# Patient Record
Sex: Male | Born: 1975 | Race: White | Hispanic: No | Marital: Single | Smoking: Never smoker
Health system: Southern US, Community
[De-identification: ages and names within clinical notes are randomized; demographics above are authoritative.]

## PROBLEM LIST (undated history)

## (undated) DIAGNOSIS — E785 Hyperlipidemia, unspecified: Secondary | ICD-10-CM

## (undated) DIAGNOSIS — G459 Transient cerebral ischemic attack, unspecified: Secondary | ICD-10-CM

## (undated) DIAGNOSIS — F1010 Alcohol abuse, uncomplicated: Secondary | ICD-10-CM

---

## 1997-12-10 ENCOUNTER — Encounter: Admission: RE | Admit: 1997-12-10 | Discharge: 1997-12-10 | Payer: Self-pay | Admitting: Family Medicine

## 1998-01-21 ENCOUNTER — Encounter: Admission: RE | Admit: 1998-01-21 | Discharge: 1998-01-21 | Payer: Self-pay | Admitting: Family Medicine

## 2001-02-25 ENCOUNTER — Encounter: Payer: Self-pay | Admitting: Emergency Medicine

## 2001-02-25 ENCOUNTER — Emergency Department (HOSPITAL_COMMUNITY): Admission: EM | Admit: 2001-02-25 | Discharge: 2001-02-25 | Payer: Self-pay | Admitting: Emergency Medicine

## 2004-04-15 ENCOUNTER — Emergency Department (HOSPITAL_COMMUNITY): Admission: EM | Admit: 2004-04-15 | Discharge: 2004-04-15 | Payer: Self-pay | Admitting: Family Medicine

## 2004-04-19 ENCOUNTER — Ambulatory Visit (HOSPITAL_COMMUNITY): Admission: RE | Admit: 2004-04-19 | Discharge: 2004-04-19 | Payer: Self-pay | Admitting: Orthopaedic Surgery

## 2004-08-31 ENCOUNTER — Emergency Department (HOSPITAL_COMMUNITY): Admission: EM | Admit: 2004-08-31 | Discharge: 2004-08-31 | Payer: Self-pay | Admitting: Family Medicine

## 2006-02-01 ENCOUNTER — Emergency Department (HOSPITAL_COMMUNITY): Admission: EM | Admit: 2006-02-01 | Discharge: 2006-02-01 | Payer: Self-pay | Admitting: Emergency Medicine

## 2006-12-14 ENCOUNTER — Emergency Department (HOSPITAL_COMMUNITY): Admission: EM | Admit: 2006-12-14 | Discharge: 2006-12-14 | Payer: Self-pay | Admitting: Family Medicine

## 2008-02-18 ENCOUNTER — Emergency Department (HOSPITAL_COMMUNITY): Admission: EM | Admit: 2008-02-18 | Discharge: 2008-02-18 | Payer: Self-pay | Admitting: Emergency Medicine

## 2008-02-20 ENCOUNTER — Emergency Department (HOSPITAL_COMMUNITY): Admission: EM | Admit: 2008-02-20 | Discharge: 2008-02-20 | Payer: Self-pay | Admitting: Family Medicine

## 2008-02-26 ENCOUNTER — Emergency Department (HOSPITAL_COMMUNITY): Admission: EM | Admit: 2008-02-26 | Discharge: 2008-02-26 | Payer: Self-pay | Admitting: Emergency Medicine

## 2011-01-20 NOTE — Op Note (Signed)
NAME:  NAGEE, GOATES                   ACCOUNT NO.:  [id number]   MEDICAL RECORD NO.:  [id number]                   PATIENT TYPE:  OIB   LOCATION:  2867                                 FACILITY:  [facility]   PHYSICIAN:  Justan Gaede. Magnus Ivan, M.D.       DATE OF BIRTH:  1976-08-02   DATE OF PROCEDURE:  04/19/2004  DATE OF DISCHARGE:  04/19/2004                                 OPERATIVE REPORT   PREOPERATIVE DIAGNOSIS:  Left index finger proximal phalanx fracture.   PROCEDURE:  Open reduction internal fixation of left index finger proximal  phalanx fracture.   SURGEON:  Crecencio Kwiatek. Magnus Ivan, M.D.   ANESTHESIA:  General.   ESTIMATED BLOOD LOSS:  Minimal.   TOURNIQUET TIME:  131 min.   ANTIBIOTICS:  Ancef 1 g.   COMPLICATIONS:  None.   INDICATIONS:  Mr. Kaneshiro is a gentleman who closed his left nondominant  hand in a car door.  When the car door slammed against his hand it caught  his left index finger, and he sustained an oblique fracture of the proximal  phalanx of his index finger.  Due to the instability of this fracture, it is  recommended that he undergo open reduction internal fixation.  He agreed  with this, given the risks and benefits that were explained to him.   DESCRIPTION OF PROCEDURE:  After being brought up to the operating room,  general anesthesia was obtained and he was placed supine on the operating  room table with his left arm on the radiolucent arm table.  Tourniquet was  placed around his upper arm, and then his arm and hand were prepped and  draped with Duraprep and sterile drapes.  An Esmarch was used to wrap around  his hand, and tourniquet was set to  250 mmHg pressure.  The fracture was assessed under fluoroscopy, and then a  dorsal approach to the index finger was made.  The incision was carried  through the subcutaneous tissue down to the level of the proximal phalanx.  The extensor tendon was identified; however, I did not dissect  through the  extensor tendon.  The first dorsal inner osseous muscle was identified and I  was able to tease some of this off of the bone without destabilizing the  inner osseous muscle.  The fracture was then identified and cleaned, and  using reduction forceps was brought into reduced position.   Next, from a radial to ulnar direction, two 2.0 mm cortical screws were  placed; holding the fracture secured.  With this reduced, the fracture was  assessed and found to be stable.  There was noted a crack distal to this in  the anterior cortex.  The proximal phalanx, however, under stressing the  finger with the 2.0 screws in place, this fracture did not displace and it  was felt that another screw would not benefit with further dissection.  The  wound itself was then copiously irrigated and the deep tissue was closed  with 3-0 Vicryl suture, followed by 3-0 Vicryl in subcutaneous tissue and  the skin then reapproximated with interrupted 3-0 nylon in a horizontal  mattress format.  Sterile dressings were then applied. This was followed by  a well-padded plaster splint for added stability.   The patient was awakened, extubated and taken to the recovery room in stable  condition.  Prior to extubation though, the tourniquet was released and the  fingers did pinken nicely.                                               Vanita Panda. Magnus Ivan, M.D.    CYB/MEDQ  D:  04/19/2004  T:  04/20/2004  Job:  [id number]

## 2018-05-23 ENCOUNTER — Ambulatory Visit (HOSPITAL_COMMUNITY)
Admission: EM | Admit: 2018-05-23 | Discharge: 2018-05-23 | Disposition: A | Discharge status: Home / Self Care | Payer: Self-pay | Attending: Family Medicine | Admitting: Family Medicine

## 2018-05-23 ENCOUNTER — Ambulatory Visit (INDEPENDENT_AMBULATORY_CARE_PROVIDER_SITE_OTHER): Payer: Self-pay

## 2018-05-23 ENCOUNTER — Other Ambulatory Visit: Payer: Self-pay

## 2018-05-23 ENCOUNTER — Encounter (HOSPITAL_COMMUNITY): Payer: Self-pay

## 2018-05-23 DIAGNOSIS — S93402A Sprain of unspecified ligament of left ankle, initial encounter: Secondary | ICD-10-CM

## 2018-05-23 NOTE — ED Provider Notes (Signed)
[facility]   [id number] 05/23/18 Arrival Time: 1327  ASSESSMENT & PLAN:  1. Sprain of left ankle, unspecified ligament, initial encounter    Imaging: Dg Ankle Complete Right  Result Date: 05/23/2018 CLINICAL DATA:  Fall with ankle pain EXAM: RIGHT ANKLE - COMPLETE 3+ VIEW COMPARISON:  None. FINDINGS: There is moderate soft tissue swelling over the lateral malleolus. No fracture or dislocation. There is a medium-sized anterior ankle effusion. IMPRESSION: Moderate lateral malleolar soft tissue swelling and medium-sized anterior ankle effusion. No fracture or dislocation. Electronically Signed   By: Deatra Robinson M.D.   On: 05/23/2018 14:23     Discharge Instructions     You have been diagnosed with an ankle sprain today. Please wear the ankle brace provided for the next week. If crutches were provided, please use them to remain non weight bearing for the next 2 days. After this, you may gradually begin to bear weight as tolerated. If possible, elevate your ankle when seated. Applying ice for 20 minutes at a time every hour as needed may help with any pain for swelling you may have. You may also take Ibuprofen 3 times daily for pain and inflammation. Follow up with your doctor or an orthopaedist in 1 week if you are not seeing significant improvement.    ASO fitted.  Reviewed expectations re: course of current medical issues. Questions answered. Outlined signs and symptoms indicating need for more acute intervention. Patient verbalized understanding. After Visit Summary given.  SUBJECTIVE: History from: patient. Walter Harper is a 42 y.o. male who reports persistent moderate pain of his right lateral ankle; described as aching without radiation. Onset: abrupt, yesterday. Injury/trama: yes, reports twisting ankle last evening; able to bear weight with discomfort; continuing discomfort today; able to wear his normal shoes Relieved by: rest with some relief Worsened  by: weight bearing and certain movements Associated symptoms: none reported. Extremity sensation changes or weakness: none. Self treatment: has not tried OTCs for relief of pain. History of similar: no  ROS: As per HPI.   OBJECTIVE:  Vitals:   05/23/18 1352 05/23/18 1354  BP: 138/88   Pulse: 91   Resp: 18   Temp: 97.7 F (36.5 C)   TempSrc: Oral   SpO2: 97%   Weight:  97.5 kg    General appearance: alert; no distress Extremities: warm and well perfused; symmetrical with no gross deformities; poorly localized tenderness over his right lateral ankle with moderate swelling and mild bruising; ROM around area or areas of discomfort: normal with some discomfort CV: brisk extremity capillary refill Skin: warm and dry Neurologic: normal gait; normal symmetric reflexes in all extremities; normal sensation in all extremities Psychological: alert and cooperative; normal mood and affect  NKDA   Social History   Socioeconomic History  . Marital status: Single    Spouse name: Not on file  . Number of children: Not on file  . Years of education: Not on file  . Highest education level: Not on file  Occupational History  . Not on file  Social Needs  . Financial resource strain: Not on file  . Food insecurity:    Worry: Not on file    Inability: Not on file  . Transportation needs:    Medical: Not on file    Non-medical: Not on file  Tobacco Use  . Smoking status: Never Smoker  . Smokeless tobacco: Never Used  Substance and Sexual Activity  . Alcohol use: Not on file  . Drug use:  Not on file  . Sexual activity: Not on file  Lifestyle  . Physical activity:    Days per week: Not on file    Minutes per session: Not on file  . Stress: Not on file  Relationships  . Social connections:    Talks on phone: Not on file    Gets together: Not on file    Attends religious service: Not on file    Active member of club or organization: Not on file    Attends meetings of clubs or  organizations: Not on file    Relationship status: Not on file  Other Topics Concern  . Not on file  Social History Narrative  . Not on file    History reviewed. No pertinent surgical history.    Mardella LaymanHagler, Vylette Strubel, MD 05/23/18 1438

## 2018-05-23 NOTE — ED Triage Notes (Signed)
Pt state he fell down last night and hurt his right ankle. ( swelling )

## 2018-05-23 NOTE — Discharge Instructions (Addendum)
You have been diagnosed with an ankle sprain today. Please wear the ankle brace provided for the next week. If crutches were provided, please use them to remain non weight bearing for the next 2 days. After this, you may gradually begin to bear weight as tolerated. If possible, elevate your ankle when seated. Applying ice for 20 minutes at a time every hour as needed may help with any pain for swelling you may have. You may also  take Ibuprofen 3 times daily for pain and inflammation. Follow up with your doctor or an orthopaedist in 1 week if you are not seeing significant improvement. °

## 2019-05-01 ENCOUNTER — Emergency Department (HOSPITAL_COMMUNITY): Payer: BLUE CROSS/BLUE SHIELD

## 2019-05-01 ENCOUNTER — Encounter (HOSPITAL_COMMUNITY): Payer: Self-pay

## 2019-05-01 ENCOUNTER — Inpatient Hospital Stay (HOSPITAL_COMMUNITY)
Admission: EM | Admit: 2019-05-01 | Discharge: 2019-05-03 | DRG: 069 | Disposition: A | Discharge status: Home / Self Care | Payer: BLUE CROSS/BLUE SHIELD | Attending: Internal Medicine | Admitting: Internal Medicine

## 2019-05-01 ENCOUNTER — Other Ambulatory Visit: Payer: Self-pay

## 2019-05-01 DIAGNOSIS — F10230 Alcohol dependence with withdrawal, uncomplicated: Secondary | ICD-10-CM | POA: Diagnosis present

## 2019-05-01 DIAGNOSIS — R739 Hyperglycemia, unspecified: Secondary | ICD-10-CM | POA: Diagnosis present

## 2019-05-01 DIAGNOSIS — R29701 NIHSS score 1: Secondary | ICD-10-CM | POA: Diagnosis present

## 2019-05-01 DIAGNOSIS — F1410 Cocaine abuse, uncomplicated: Secondary | ICD-10-CM | POA: Diagnosis present

## 2019-05-01 DIAGNOSIS — I10 Essential (primary) hypertension: Secondary | ICD-10-CM | POA: Diagnosis present

## 2019-05-01 DIAGNOSIS — E785 Hyperlipidemia, unspecified: Secondary | ICD-10-CM | POA: Diagnosis present

## 2019-05-01 DIAGNOSIS — N390 Urinary tract infection, site not specified: Secondary | ICD-10-CM | POA: Diagnosis present

## 2019-05-01 DIAGNOSIS — I739 Peripheral vascular disease, unspecified: Secondary | ICD-10-CM | POA: Diagnosis present

## 2019-05-01 DIAGNOSIS — Z20828 Contact with and (suspected) exposure to other viral communicable diseases: Secondary | ICD-10-CM | POA: Diagnosis present

## 2019-05-01 DIAGNOSIS — I639 Cerebral infarction, unspecified: Secondary | ICD-10-CM | POA: Diagnosis not present

## 2019-05-01 DIAGNOSIS — R200 Anesthesia of skin: Secondary | ICD-10-CM | POA: Diagnosis not present

## 2019-05-01 DIAGNOSIS — E876 Hypokalemia: Secondary | ICD-10-CM | POA: Diagnosis present

## 2019-05-01 DIAGNOSIS — Z79899 Other long term (current) drug therapy: Secondary | ICD-10-CM | POA: Diagnosis not present

## 2019-05-01 DIAGNOSIS — G459 Transient cerebral ischemic attack, unspecified: Principal | ICD-10-CM | POA: Diagnosis present

## 2019-05-01 DIAGNOSIS — I6389 Other cerebral infarction: Secondary | ICD-10-CM | POA: Diagnosis not present

## 2019-05-01 LAB — ETHANOL: Alcohol, Ethyl (B): <10 mg/dL (ref <10)

## 2019-05-01 LAB — COMPREHENSIVE METABOLIC PANEL
ALT: 39 U/L (ref 0–44)
AST: 58 U/L — ABNORMAL HIGH (ref 15–41)
Albumin: 5 g/dL (ref 3.5–5.0)
Alkaline Phosphatase: 71 U/L (ref 38–126)
Anion gap: 18 — ABNORMAL HIGH (ref 5–15)
BUN: 8 mg/dL (ref 6–20)
CO2: 28 mmol/L (ref 22–32)
Calcium: 9.7 mg/dL (ref 8.9–10.3)
Chloride: 94 mmol/L — ABNORMAL LOW (ref 98–111)
Creatinine, Ser: 0.82 mg/dL (ref 0.61–1.24)
GFR calc Af Amer: >60 mL/min (ref >60)
GFR calc non Af Amer: >60 mL/min (ref >60)
Glucose, Bld: 123 mg/dL — ABNORMAL HIGH (ref 70–99)
Potassium: 2.8 mmol/L — ABNORMAL LOW (ref 3.5–5.1)
Sodium: 140 mmol/L (ref 135–145)
Total Bilirubin: 1.1 mg/dL (ref 0.3–1.2)
Total Protein: 8.4 g/dL — ABNORMAL HIGH (ref 6.5–8.1)

## 2019-05-01 LAB — URINALYSIS, ROUTINE W REFLEX MICROSCOPIC
Bacteria, UA: NONE SEEN
Bilirubin Urine: NEGATIVE
Glucose, UA: 50 mg/dL — AB
Hgb urine dipstick: NEGATIVE
Ketones, ur: 20 mg/dL — AB
Nitrite: NEGATIVE
Protein, ur: 30 mg/dL — AB
Specific Gravity, Urine: 1.024 (ref 1.005–1.030)
pH: 5 (ref 5.0–8.0)

## 2019-05-01 LAB — I-STAT CHEM 8, ED
BUN: 6 mg/dL (ref 6–20)
Calcium, Ion: 1.09 mmol/L — ABNORMAL LOW (ref 1.15–1.40)
Chloride: 95 mmol/L — ABNORMAL LOW (ref 98–111)
Creatinine, Ser: 0.7 mg/dL (ref 0.61–1.24)
Glucose, Bld: 122 mg/dL — ABNORMAL HIGH (ref 70–99)
HCT: 49 % (ref 39.0–52.0)
Hemoglobin: 16.7 g/dL (ref 13.0–17.0)
Potassium: 2.9 mmol/L — ABNORMAL LOW (ref 3.5–5.1)
Sodium: 139 mmol/L (ref 135–145)
TCO2: 30 mmol/L (ref 22–32)

## 2019-05-01 LAB — DIFFERENTIAL
Abs Immature Granulocytes: 0.02 10*3/uL (ref 0.00–0.07)
Basophils Absolute: 0.1 10*3/uL (ref 0.0–0.1)
Basophils Relative: 1 %
Eosinophils Absolute: 0 10*3/uL (ref 0.0–0.5)
Eosinophils Relative: 0 %
Immature Granulocytes: 0 %
Lymphocytes Relative: 10 %
Lymphs Abs: 1 10*3/uL (ref 0.7–4.0)
Monocytes Absolute: 0.7 10*3/uL (ref 0.1–1.0)
Monocytes Relative: 7 %
Neutro Abs: 8.1 10*3/uL — ABNORMAL HIGH (ref 1.7–7.7)
Neutrophils Relative %: 82 %

## 2019-05-01 LAB — RAPID URINE DRUG SCREEN, HOSP PERFORMED
Amphetamines: NOT DETECTED
Barbiturates: NOT DETECTED
Benzodiazepines: NOT DETECTED
Cocaine: POSITIVE — AB
Opiates: NOT DETECTED
Tetrahydrocannabinol: NOT DETECTED

## 2019-05-01 LAB — CBC
HCT: 46.5 % (ref 39.0–52.0)
Hemoglobin: 16.3 g/dL (ref 13.0–17.0)
MCH: 33.3 pg (ref 26.0–34.0)
MCHC: 35.1 g/dL (ref 30.0–36.0)
MCV: 95.1 fL (ref 80.0–100.0)
Platelets: 268 10*3/uL (ref 150–400)
RBC: 4.89 MIL/uL (ref 4.22–5.81)
RDW: 13.1 % (ref 11.5–15.5)
WBC: 9.8 10*3/uL (ref 4.0–10.5)
nRBC: 0 % (ref 0.0–0.2)

## 2019-05-01 LAB — PROTIME-INR
INR: 1 (ref 0.8–1.2)
Prothrombin Time: 12.7 seconds (ref 11.4–15.2)

## 2019-05-01 LAB — APTT: aPTT: 27 seconds (ref 24–36)

## 2019-05-01 MED ORDER — ADULT MULTIVITAMIN W/MINERALS CH
1.0000 | ORAL_TABLET | Freq: Once | ORAL | Status: AC
  Administered 2019-05-01: 1 via ORAL
  Filled 2019-05-01: qty 1

## 2019-05-01 MED ORDER — VITAMIN B-1 100 MG PO TABS
100.0000 mg | ORAL_TABLET | Freq: Once | ORAL | Status: AC
  Administered 2019-05-01: 20:00:00 100 mg via ORAL
  Filled 2019-05-01: qty 1

## 2019-05-01 MED ORDER — LORAZEPAM 2 MG/ML IJ SOLN
2.0000 mg | Freq: Once | INTRAMUSCULAR | Status: AC
  Administered 2019-05-01: 2 mg via INTRAVENOUS
  Filled 2019-05-01: qty 1

## 2019-05-01 MED ORDER — POTASSIUM CHLORIDE CRYS ER 20 MEQ PO TBCR
40.0000 meq | EXTENDED_RELEASE_TABLET | Freq: Once | ORAL | Status: AC
  Administered 2019-05-01: 20:00:00 40 meq via ORAL
  Filled 2019-05-01: qty 2

## 2019-05-01 NOTE — Progress Notes (Signed)
Dr. Wilson Singer called about this patient who presented as an acute code stroke with left-sided numbness.  Evaluated by telemedicine neurology.  Offered TPA, patient declined.  I recommended admission to [facility] for stroke work-up as recommended by telemedicine neurology.  Patient will be seen by stroke team upon transfer.  -- Amie Portland, MD [facility] Pager: [phone] If 7pm to 7am, please call on call as listed on AMION.

## 2019-05-01 NOTE — ED Notes (Signed)
Dr. Kohut at bedside 

## 2019-05-01 NOTE — Consult Note (Signed)
[facility] Tele[facility]urology Consult Services   Date of Service:   05/01/2019 19:16:01  Impression:     .  Rule Out Acute Ischemic Stroke  Comments/Sign-Out: 43 YO M with no significant Past medical history presented with left sided numbness that started at 17:00. Need to rule out stroke.  Metrics: Last Known Well: 05/01/2019 17:00:00 [facility] Notification Time: 05/01/2019 19:16:01 Arrival Time: 05/01/2019 18:47:00 Stamp Time: 05/01/2019 19:16:01 Time First Login Attempt: 05/01/2019 19:21:36 Video Start Time: 05/01/2019 19:21:36  Symptoms: left sided numbness NIHSS Start Assessment Time: 05/01/2019 19:23:48 Patient is not a candidate for Alteplase/Activase. Patient was not deemed candidate for Alteplase/Activase thrombolytics because of Minor non disabling symptoms. TPA was offered but patient refused.. Video End Time: 05/01/2019 19:29:37  CT head showed no acute hemorrhage or acute core infarct.  Clinical Presentation is not Suggestive of Large Vessel Occlusive Disease  ED Physician notified of diagnostic impression and management plan on 05/01/2019 19:29:42  Our recommendations are outlined below.  Recommendations:     .  Activate Stroke Protocol Admission/Order Set     .  Stroke/Telemetry Floor     .  Neuro Checks     .  Bedside Swallow Eval     .  DVT Prophylaxis     .  IV Fluids, Normal Saline     .  Head of Bed 30 Degrees     .  Euglycemia and Avoid Hyperthermia (PRN Acetaminophen)     .  Initiate Aspirin 81 MG Daily  Routine Consultation with [facility] for Follow up Care  Sign Out:     .  Discussed with Emergency Department Provider    ------------------------------------------------------------------------------  History of Present Illness: Patient is a 43 year old Male.  Patient was brought by private transportation with symptoms of left sided numbness  43 YO M with no significant PMH presented with left sided numbness that  started at 17:00. He states that symptoms come all of a sudden. He denies any speech or language deficits. He denies any weakness. Denies any headache. No h/o strokes or HTN.    Past Medical History:  Anticoagulant use:  No  Antiplatelet use: No  Examination: BP(169/96), Blood Glucose(122) 1A: Level of Consciousness - Alert; keenly responsive + 0 1B: Ask Month and Age - Both Questions Right + 0 1C: Blink Eyes & Squeeze Hands - Performs Both Tasks + 0 2: Test Horizontal Extraocular Movements - Normal + 0 3: Test Visual Fields - No Visual Loss + 0 4: Test Facial Palsy (Use Grimace if Obtunded) - Normal symmetry + 0 5A: Test Left Arm Motor Drift - No Drift for 10 Seconds + 0 5B: Test Right Arm Motor Drift - No Drift for 10 Seconds + 0 6A: Test Left Leg Motor Drift - No Drift for 5 Seconds + 0 6B: Test Right Leg Motor Drift - No Drift for 5 Seconds + 0 7: Test Limb Ataxia (FNF/Heel-Shin) - No Ataxia + 0 8: Test Sensation - Mild-Moderate Loss: Less Sharp/More Dull + 1 9: Test Language/Aphasia - Normal; No aphasia + 0 10: Test Dysarthria - Normal + 0 11: Test Extinction/Inattention - No abnormality + 0  NIHSS Score: 1  Patient/Family was informed the Neurology Consult would happen via TeleHealth consult by way of interactive audio and video telecommunications and consented to receiving care in this manner.   Due to the immediate potential for life-threatening deterioration due to underlying acute neurologic illness, I spent 35 minutes providing critical care. This time includes  time for face to face visit via telemedicine, review of medical records, imaging studies and discussion of findings with providers, the patient and/or family.   Dr Venia CarbonMuhammad Jakavion Bilodeau   [facility] [phone]  Case [id number]

## 2019-05-01 NOTE — ED Notes (Addendum)
Patient found in doorway and assisted back to bed. Patient asking if mom is in hallway and calling her name. Patient reoriented and assured that mother is not in hallway. Patient provided urinal. Will continue to monitor patient.

## 2019-05-01 NOTE — ED Triage Notes (Signed)
Pt states he has been having left sided arm numbness and weakness since 1730. Code Stroke called at [facility]

## 2019-05-01 NOTE — ED Notes (Signed)
Patient increasingly confused, agitated, and repeating questions that have already been answered. Patient standing in door way asking "what is down there?" and motioning to the floor. Patient assisted back to bed safely and instructed to not get up without assistance. Also keeps asking about family member and having a visitor. Visitor policy reviewed with patient and offered to contact family member, but patient not willing to give number to contact family member. When inquiring about alcohol intake, patient notified RN that he has not had anything to drink today and his last drink was last night, but he normally drinks every day all day. CIWA will be completed. EDP made aware.

## 2019-05-01 NOTE — ED Notes (Signed)
Stroke Swallow Screen completed prior to medication administration.

## 2019-05-01 NOTE — ED Provider Notes (Signed)
[facility]-EMERGENCY DEPT Provider Note   CSN: [id number] Arrival date & time: 05/01/19  1847     History   Chief Complaint No chief complaint on file.   HPI Walter Harper is a 43 y.o. male.     HPI   43 year old male with left lower extremity numbness.  Symptom onset at 5 PM today.  Circumferential numbness in left upper extremity from his fingers up to about the shoulder level.  Symptoms have been persistent since then.  He feels like his left arm is weak.  He was in his usual state of health earlier in the day.  Denies any headaches or neck pain.  No trauma.  No acute visual changes.  No history of similar symptoms.  Denies any similar past medical history for similar symptoms previously.  No chronic medications.  Occasionally takes antihistamines for congestion.  Denies any drug use.  Drinks up to 1/5 of liquor most days.  No past medical history on file.  There are no active problems to display for this patient.   No past surgical history on file.      Home Medications    Prior to Admission medications   Not on File    Family History No family history on file.  Social History Social History   Tobacco Use   Smoking status: Never Smoker   Smokeless tobacco: Never Used  Substance Use Topics   Alcohol use: Not on file   Drug use: Not on file     Allergies   Patient has no allergy information on record.   Review of Systems Review of Systems  All systems reviewed and negative, other than as noted in HPI.  Physical Exam Updated Vital Signs BP (!) 165/97    Pulse (!) 122    Temp 99.8 F (37.7 C) (Oral)    Resp 16    SpO2 100%   Physical Exam Vitals signs and nursing note reviewed.  Constitutional:      Appearance: He is well-developed.     Comments: Laying in bed.  Appears somewhat anxious.  HENT:     Head: Normocephalic and atraumatic.  Eyes:     General:        Right eye: No discharge.        Left eye: No  discharge.     Conjunctiva/sclera: Conjunctivae normal.  Neck:     Musculoskeletal: Neck supple.  Cardiovascular:     Rate and Rhythm: Regular rhythm. Tachycardia present.     Heart sounds: Normal heart sounds. No murmur. No friction rub. No gallop.   Pulmonary:     Effort: Pulmonary effort is normal. No respiratory distress.     Breath sounds: Normal breath sounds.  Abdominal:     General: There is no distension.     Palpations: Abdomen is soft.     Tenderness: There is no abdominal tenderness.  Musculoskeletal:        General: No tenderness.  Skin:    General: Skin is warm and dry.  Neurological:     Mental Status: He is alert and oriented to person, place, and time.     Comments: Speech is clear.  Content appropriate.  Following commands although easily distracted.  Cranial nerves II through XII intact.  Tremor noted.  Prompt mild drift in left upper extremity but exam is limited by tremor.  Strength is otherwise normal.  Decreased sensation to light touch and left upper extremity circumferentially from about the anterior  deltoid down into the fingers.  Some difficulty with finger-nose testing bilaterally again because of the tremor, but no obvious past pointing.  Did not attempt to ambulate.  Psychiatric:        Behavior: Behavior normal.        Thought Content: Thought content normal.      ED Treatments / Results  Labs (all labs ordered are listed, but only abnormal results are displayed) Labs Reviewed - No data to display  EKG EKG Interpretation  Date/Time:  Thursday May 01 2019 19:30:57 EDT Ventricular Rate:  107 PR Interval:    QRS Duration: 112 QT Interval:  348 QTC Calculation: 465 R Axis:   88 Text Interpretation:  Sinus tachycardia Incomplete right bundle branch block Confirmed by Virgel Manifold [phone]) on 05/01/2019 8:35:39 PM   Radiology No results found.   Ct Angio Head W Or Wo Contrast  Result Date: 05/02/2019 CLINICAL DATA:  Stroke EXAM: CT  ANGIOGRAPHY HEAD AND NECK TECHNIQUE: Multidetector CT imaging of the head and neck was performed using the standard protocol during bolus administration of intravenous contrast. Multiplanar CT image reconstructions and MIPs were obtained to evaluate the vascular anatomy. Carotid stenosis measurements (when applicable) are obtained utilizing NASCET criteria, using the distal internal carotid diameter as the denominator. CONTRAST:  38mL OMNIPAQUE IOHEXOL 350 MG/ML SOLN COMPARISON:  CT head 05/01/2019 FINDINGS: CTA NECK FINDINGS Aortic arch: Bovine branching pattern. Normal aortic arch. Proximal great vessels normal. Right carotid system: Normal right carotid. Negative for stenosis atherosclerotic disease or dissection Left carotid system: Normal left carotid. Negative for stenosis atherosclerotic disease or dissection. Vertebral arteries: Normal vertebral bilaterally. Skeleton: Negative Other neck: Negative Upper chest: Negative Review of the MIP images confirms the above findings CTA HEAD FINDINGS Anterior circulation: Cavernous carotid widely patent bilaterally without stenosis or aneurysm. Anterior and middle cerebral arteries normal bilaterally. Posterior circulation: Both vertebral arteries patent to the basilar. Basilar widely patent. AICA, superior cerebellar, posterior cerebral arteries patent bilaterally without stenosis. Venous sinuses: Normal venous enhancement. Anatomic variants: None Review of the MIP images confirms the above findings IMPRESSION: Normal CTA head neck. No stenosis aneurysm or dissection identified. Normal intracranial circulation. Electronically Signed   By: Franchot Gallo M.D.   On: 05/02/2019 11:54   Ct Angio Neck W Or Wo Contrast  Result Date: 05/02/2019 CLINICAL DATA:  Stroke EXAM: CT ANGIOGRAPHY HEAD AND NECK TECHNIQUE: Multidetector CT imaging of the head and neck was performed using the standard protocol during bolus administration of intravenous contrast. Multiplanar CT image  reconstructions and MIPs were obtained to evaluate the vascular anatomy. Carotid stenosis measurements (when applicable) are obtained utilizing NASCET criteria, using the distal internal carotid diameter as the denominator. CONTRAST:  45mL OMNIPAQUE IOHEXOL 350 MG/ML SOLN COMPARISON:  CT head 05/01/2019 FINDINGS: CTA NECK FINDINGS Aortic arch: Bovine branching pattern. Normal aortic arch. Proximal great vessels normal. Right carotid system: Normal right carotid. Negative for stenosis atherosclerotic disease or dissection Left carotid system: Normal left carotid. Negative for stenosis atherosclerotic disease or dissection. Vertebral arteries: Normal vertebral bilaterally. Skeleton: Negative Other neck: Negative Upper chest: Negative Review of the MIP images confirms the above findings CTA HEAD FINDINGS Anterior circulation: Cavernous carotid widely patent bilaterally without stenosis or aneurysm. Anterior and middle cerebral arteries normal bilaterally. Posterior circulation: Both vertebral arteries patent to the basilar. Basilar widely patent. AICA, superior cerebellar, posterior cerebral arteries patent bilaterally without stenosis. Venous sinuses: Normal venous enhancement. Anatomic variants: None Review of the MIP images confirms the above findings IMPRESSION:  Normal CTA head neck. No stenosis aneurysm or dissection identified. Normal intracranial circulation. Electronically Signed   By: Marlan Palau M.D.   On: 05/02/2019 11:54   Mr Brain Wo Contrast  Result Date: 05/02/2019 CLINICAL DATA:  Left arm numbness beginning yesterday. Negative CT angiography. EXAM: MRI HEAD WITHOUT CONTRAST TECHNIQUE: Multiplanar, multiecho pulse sequences of the brain and surrounding structures were obtained without intravenous contrast. COMPARISON:  CT studies yesterday and today. FINDINGS: Brain: The brain has a normal appearance without evidence of malformation, atrophy, old or acute small or large vessel infarction, mass  lesion, hemorrhage, hydrocephalus or extra-axial collection. Vascular: Major vessels at the base of the brain show flow. Venous sinuses appear patent. Skull and upper cervical spine: Normal. Sinuses/Orbits: Clear/normal. Other: None significant. IMPRESSION: Normal examination. No abnormality seen to explain the clinical presentation. Electronically Signed   By: Paulina Fusi M.D.   On: 05/02/2019 16:08   Ct Head Code Stroke Wo Contrast  Result Date: 05/01/2019 CLINICAL DATA:  Code stroke. Left sided arm numbness beginning 1800 hours EXAM: CT HEAD WITHOUT CONTRAST TECHNIQUE: Contiguous axial images were obtained from the base of the skull through the vertex without intravenous contrast. COMPARISON:  None. FINDINGS: Brain: Normal appearance without evidence of atrophy, old or acute infarction, mass lesion, hemorrhage, hydrocephalus or extra-axial collection. Vascular: No abnormal vascular finding. Skull: Negative Sinuses/Orbits: Clear/normal Other: Sebaceous cysts of the scalp. ASPECTS [facility] Stroke Program Early CT Score) - Ganglionic level infarction (caudate, lentiform nuclei, internal capsule, insula, M1-M3 cortex): 7 - Supraganglionic infarction (M4-M6 cortex): 3 Total score (0-10 with 10 being normal): 10 IMPRESSION: 1. No acute CT finding.  Normal appearance of the brain. 2. ASPECTS is 10. 3. These results were called by telephone at the time of interpretation on 05/01/2019 at 7:16 pm to Dr. Raeford Razor , who verbally acknowledged these results. Electronically Signed   By: Paulina Fusi M.D.   On: 05/01/2019 19:18   Procedures Procedures (including critical care time)  Medications Ordered in ED Medications - No data to display   Initial Impression / Assessment and Plan / ED Course  I have reviewed the triage vital signs and the nursing notes.  Pertinent labs & imaging results that were available during my care of the patient were reviewed by me and considered in my medical decision making (see  chart for details).  43 year old male with left upper extremity weakness and numbness.  Decreased sensation to light touch and questionable drift in his left upper extremity.  Exam is limited by tremor though.  Likely alcohol withdrawal.  Patient states that he drinks 1/5 every day.  Last drink was about 10 PM yesterday.  He is tachycardic, hypertensive and seems to be somewhat hyper alert and quick to startle.  Given symptoms and equivocal exam, I feel that it's best that he be emergently evaluated by neurology.  Code stroke activated.  Benzodiazepines deferred until their assessment.   Initial CT negative.  Evaluated by tele-neurology and recommended further stroke work-up.  I feel that is probably significant component of alcohol withdrawal.  Benzos ordered.  Admission on the medical service.  Final Clinical Impressions(s) / ED Diagnoses   Final diagnoses:  TIA (transient ischemic attack)  Alcohol withdrawal  ED Discharge Orders    None       Raeford Razor, MD 05/05/19 1134

## 2019-05-01 NOTE — H&P (Signed)
History and Physical   Walter Harper Walter Harper:[id number] DOB: 16-Dec-1975 DOA: 05/01/2019  Referring MD/NP/PA: Dr. Juleen China  PCP: Patient, No Pcp Per   Outpatient Specialists: None  Patient coming from: Home  Chief Complaint: Left arm numbness  HPI: Walter Harper is a 43 y.o. male with medical history significant of alcohol abuse, no significant cardiac history who presented to the ER with left upper extremity numbness and weakness.  Symptoms started around 5 PM today.  They were persistent when he arrived the ER about 2 hours later.  He was offered TPA but declined.  No known trauma no any other injuries.  Denied any significant family history of CVA.  Initial code stroke CT head was negative patient is not on any medications.  He is not aware of his cholesterol status.  He is never smoked.  In the ER he was seen by tele-neurology and recommended stroke work-up out.  He still has lingering numbness on the left lower extremity but mostly resolved.  Patient is being admitted therefore for treatment..  ED Course: Temperature 99.8 blood pressure 122/94 pulse 122 respirate 24 oxygen sat 96 on room air.  White count is 9.8 hemoglobin 16.7 platelets 268.  Sodium is 139 potassium 2.9 chloride 95 CO2 28 BUN 6 creatinine 0.70.  Drug screen is positive for cocaine.  EKG shows sinus tachycardia.  Urinalysis showed WBC 21-50 no bacteria negative nitrite negative leukocytes.  Patient is being admitted for CVA work-up.  Review of Systems: As per HPI otherwise 10 point review of systems negative.    History reviewed. No pertinent past medical history.  History reviewed. No pertinent surgical history.   reports that he has never smoked. He has never used smokeless tobacco. He reports current alcohol use. No history on file for drug.  No Known Allergies  History reviewed. No pertinent family history.   Prior to Admission medications   Not on File    Physical Exam: Vitals:   05/01/19 2115  05/01/19 2130 05/01/19 2230 05/01/19 2300  BP: (!) 144/86 130/86 (!) 141/92 (!) 136/91  Pulse: (!) 102 (!) 102 98 98  Resp: 17 11 (!) 22 19  Temp:      TempSrc:      SpO2: 96% 97% 97% 96%  Weight:          Constitutional: NAD, stuporous, no acute distress Vitals:   05/01/19 2115 05/01/19 2130 05/01/19 2230 05/01/19 2300  BP: (!) 144/86 130/86 (!) 141/92 (!) 136/91  Pulse: (!) 102 (!) 102 98 98  Resp: 17 11 (!) 22 19  Temp:      TempSrc:      SpO2: 96% 97% 97% 96%  Weight:       Eyes: PERRL, lids and conjunctivae normal ENMT: Mucous membranes are moist. Posterior pharynx clear of any exudate or lesions.Normal dentition.  Neck: normal, supple, no masses, no thyromegaly Respiratory: clear to auscultation bilaterally, no wheezing, no crackles. Normal respiratory effort. No accessory muscle use.  Cardiovascular: Tachycardia, no murmurs / rubs / gallops. No extremity edema. 2+ pedal pulses. No carotid bruits.  Abdomen: no tenderness, no masses palpated. No hepatosplenomegaly. Bowel sounds positive.  Musculoskeletal: no clubbing / cyanosis. No joint deformity upper and lower extremities. Good ROM, no contractures. Normal muscle tone.  Skin: no rashes, lesions, ulcers. No induration Neurologic: CN 2-12 grossly intact. Sensation intact, DTR normal. Strength 5/5 in all 4.  No focal weakness, slightly having tremors Psychiatric: Patient is slightly confused with mild tremors, normal mood.  Labs on Admission: I have personally reviewed following labs and imaging studies  CBC: Recent Labs  Lab 05/01/19 1904 05/01/19 1911  WBC 9.8  --   NEUTROABS 8.1*  --   HGB 16.3 16.7  HCT 46.5 49.0  MCV 95.1  --   PLT 268  --    Basic Metabolic Panel: Recent Labs  Lab 05/01/19 1904 05/01/19 1911  NA 140 139  K 2.8* 2.9*  CL 94* 95*  CO2 28  --   GLUCOSE 123* 122*  BUN 8 6  CREATININE 0.82 0.70  CALCIUM 9.7  --    GFR: CrCl cannot be calculated (Unknown ideal weight.). Liver  Function Tests: Recent Labs  Lab 05/01/19 1904  AST 58*  ALT 39  ALKPHOS 71  BILITOT 1.1  PROT 8.4*  ALBUMIN 5.0   No results for input(s): LIPASE, AMYLASE in the last 168 hours. No results for input(s): AMMONIA in the last 168 hours. Coagulation Profile: Recent Labs  Lab 05/01/19 1904  INR 1.0   Cardiac Enzymes: No results for input(s): CKTOTAL, CKMB, CKMBINDEX, TROPONINI in the last 168 hours. BNP (last 3 results) No results for input(s): PROBNP in the last 8760 hours. HbA1C: No results for input(s): HGBA1C in the last 72 hours. CBG: No results for input(s): GLUCAP in the last 168 hours. Lipid Profile: No results for input(s): CHOL, HDL, LDLCALC, TRIG, CHOLHDL, LDLDIRECT in the last 72 hours. Thyroid Function Tests: No results for input(s): TSH, T4TOTAL, FREET4, T3FREE, THYROIDAB in the last 72 hours. Anemia Panel: No results for input(s): VITAMINB12, FOLATE, FERRITIN, TIBC, IRON, RETICCTPCT in the last 72 hours. Urine analysis:    Component Value Date/Time   COLORURINE AMBER (A) 05/01/2019 1904   APPEARANCEUR CLEAR 05/01/2019 1904   LABSPEC 1.024 05/01/2019 1904   PHURINE 5.0 05/01/2019 1904   GLUCOSEU 50 (A) 05/01/2019 1904   HGBUR NEGATIVE 05/01/2019 1904   BILIRUBINUR NEGATIVE 05/01/2019 1904   KETONESUR 20 (A) 05/01/2019 1904   PROTEINUR 30 (A) 05/01/2019 1904   NITRITE NEGATIVE 05/01/2019 1904   LEUKOCYTESUR TRACE (A) 05/01/2019 1904   Sepsis Labs: @LABRCNTIP (procalcitonin:4,lacticidven:4) )No results found for this or any previous visit (from the past 240 hour(s)).   Radiological Exams on Admission: Ct Head Code Stroke Wo Contrast  Result Date: 05/01/2019 CLINICAL DATA:  Code stroke. Left sided arm numbness beginning 1800 hours EXAM: CT HEAD WITHOUT CONTRAST TECHNIQUE: Contiguous axial images were obtained from the base of the skull through the vertex without intravenous contrast. COMPARISON:  None. FINDINGS: Brain: Normal appearance without evidence of  atrophy, old or acute infarction, mass lesion, hemorrhage, hydrocephalus or extra-axial collection. Vascular: No abnormal vascular finding. Skull: Negative Sinuses/Orbits: Clear/normal Other: Sebaceous cysts of the scalp. ASPECTS [facility]([place] Stroke Program Early CT Score) - Ganglionic level infarction (caudate, lentiform nuclei, internal capsule, insula, M1-M3 cortex): 7 - Supraganglionic infarction (M4-M6 cortex): 3 Total score (0-10 with 10 being normal): 10 IMPRESSION: 1. No acute CT finding.  Normal appearance of the brain. 2. ASPECTS is 10. 3. These results were called by telephone at the time of interpretation on 05/01/2019 at 7:16 pm to Dr. Raeford RazorSTEPHEN KOHUT , who verbally acknowledged these results. Electronically Signed   By: Paulina FusiMark  Shogry M.D.   On: 05/01/2019 19:18    EKG: Independently reviewed.  It shows sinus tachycardia with a rate of 120.  No significant ST changes  Assessment/Plan Principal Problem:   Acute cerebrovascular accident (CVA) (HCC) Active Problems:   Alcohol dependence with uncomplicated withdrawal (HCC)  Hypokalemia   Hyperglycemia   Cocaine abuse (HCC)   Acute lower UTI     #1 suspected CVA: Patient has no other risk factor except his positive for cocaine which could be a risk for his CVA.  Negative head CT.  Will admit to [facility].  MRI of the brain with carotid Dopplers and echocardiogram.  Patient will be seen and followed by neurology.  #2 history of alcoholism: Alcohol level is less than 10.  We will continue to monitor.  Initial CIWA protocol.  He is beginning to have some tremors.  He drinks liquor every day.  His last drink was last night  #3 hypokalemia: Replete potassium.  #4 hyperglycemia: Incidental finding.  Not a diabetic.  May be related to his alcohol intake.  Monitor blood sugar.  #5 cocaine abuse: Counseling to be provided.  Continue monitor     DVT prophylaxis: Lovenox Code Status: Full code Family Communication: Patient's fianc at bedside  Disposition Plan: Home Consults called: Neurology Admission status: Inpatient  Severity of Illness: The appropriate patient status for this patient is INPATIENT. Inpatient status is judged to be reasonable and necessary in order to provide the required intensity of service to ensure the patient's safety. The patient's presenting symptoms, physical exam findings, and initial radiographic and laboratory data in the context of their chronic comorbidities is felt to place them at high risk for further clinical deterioration. Furthermore, it is not anticipated that the patient will be medically stable for discharge from the hospital within 2 midnights of admission. The following factors support the patient status of inpatient.   " The patient's presenting symptoms include left upper extremity numbness. " The worrisome physical exam findings include stuporous patient. " The initial radiographic and laboratory data are worrisome because of positive for cocaine. " The chronic co-morbidities include alcoholism polysubstance abuse.   * I certify that at the point of admission it is my clinical judgment that the patient will require inpatient hospital care spanning beyond 2 midnights from the point of admission due to high intensity of service, high risk for further deterioration and high frequency of surveillance required.Barbette Merino MD [facility] Pager [phone]  If 7PM-7AM, please contact night-coverage [email or web address] Password [facility]  05/02/2019, 12:36 AM

## 2019-05-01 NOTE — ED Notes (Signed)
ED TO INPATIENT HANDOFF REPORT  Name/Age/Gender Walter Harper 43 y.o. male  Code Status   Home/SNF/Other Home  Chief Complaint Left side Numb  Level of Care/Admitting Diagnosis ED Disposition    ED Disposition Condition Comment   Admit  [facility] [[id number]]  Level of Care: Telemetry Medical [104]  Covid Evaluation: Asymptomatic Screening Protocol (No Symptoms)  Diagnosis: Acute cerebrovascular accident (CVA) [facility](HCC) [[id number]]) [[id number]]  Admitting Physician: Rometta EmeryGARBA, MOHAMMAD L [[id number]]  Attending Physician: Rometta EmeryGARBA, MOHAMMAD L [[id number]]  Estimated length of stay: past midnight tomorrow  Certification:: I certify this patient will need inpatient services for at least 2 midnights  PT Class (Do Not Modify): Inpatient [101]  PT Acc Code (Do Not Modify): Private [1]       Medical History History reviewed. No pertinent past medical history.  Allergies No Known Allergies  IV Location/Drains/Wounds Patient Lines/Drains/Airways Status   Active Line/Drains/Airways    Name:   Placement date:   Placement time:   Site:   Days:   Peripheral IV 05/01/19 Right Antecubital   05/01/19    1904    Antecubital   less than 1          Labs/Imaging Results for orders placed or performed during the hospital encounter of 05/01/19 (from the past 48 hour(s))  Ethanol     Status: None   Collection Time: 05/01/19  7:04 PM  Result Value Ref Range   Alcohol, Ethyl (B) <10 <10 mg/dL    Comment: (NOTE) Lowest detectable limit for serum alcohol is 10 mg/dL. For medical purposes only. Performed at [facility], [address] [place], [place] [address]   Protime-INR     Status: None   Collection Time: 05/01/19  7:04 PM  Result Value Ref Range   Prothrombin Time 12.7 11.4 - 15.2 seconds   INR 1.0 0.8 - 1.2    Comment: (NOTE) INR goal varies based on device and disease states. Performed at [facility], [address] [place], [place] [address]   APTT     Status: None   Collection Time: 05/01/19  7:04 PM  Result Value Ref Range   aPTT 27 24 - 36 seconds    Comment: Performed at [facility], [address] [place], [place] [address]  CBC     Status: None   Collection Time: 05/01/19  7:04 PM  Result Value Ref Range   WBC 9.8 4.0 - 10.5 K/uL   RBC 4.89 4.22 - 5.81 MIL/uL   Hemoglobin 16.3 13.0 - 17.0 g/dL   HCT 56.246.5 13.039.0 - 86.552.0 %   MCV 95.1 80.0 - 100.0 fL   MCH 33.3 26.0 - 34.0 pg   MCHC 35.1 30.0 - 36.0 g/dL   RDW 78.413.1 69.611.5 - 29.515.5 %   Platelets 268 150 - 400 K/uL   nRBC 0.0 0.0 - 0.2 %    Comment: Performed at [facility], [address] [place], [place] [address]  Differential     Status: Abnormal   Collection Time: 05/01/19  7:04 PM  Result Value Ref Range   Neutrophils Relative % 82 %   Neutro Abs 8.1 (H) 1.7 - 7.7 K/uL   Lymphocytes Relative 10 %   Lymphs Abs 1.0 0.7 - 4.0 K/uL   Monocytes Relative 7 %   Monocytes Absolute 0.7 0.1 - 1.0 K/uL   Eosinophils Relative 0 %   Eosinophils Absolute 0.0 0.0 - 0.5 K/uL  Basophils Relative 1 %   Basophils Absolute 0.1 0.0 - 0.1 K/uL   Immature Granulocytes 0 %   Abs Immature Granulocytes 0.02 0.00 - 0.07 K/uL    Comment: Performed at [facility], [address] [place], [place] [address]  Comprehensive metabolic panel     Status: Abnormal   Collection Time: 05/01/19  7:04 PM  Result Value Ref Range   Sodium 140 135 - 145 mmol/L   Potassium 2.8 (L) 3.5 - 5.1 mmol/L   Chloride 94 (L) 98 - 111 mmol/L   CO2 28 22 - 32 mmol/L   Glucose, Bld 123 (H) 70 - 99 mg/dL   BUN 8 6 - 20 mg/dL   Creatinine, Ser 6.210.82 0.61 - 1.24 mg/dL   Calcium 9.7 8.9 - 30.810.3 mg/dL   Total Protein 8.4 (H) 6.5 - 8.1 g/dL   Albumin 5.0 3.5 - 5.0 g/dL   AST 58 (H) 15 - 41 U/L   ALT 39 0 - 44 U/L   Alkaline Phosphatase 71 38 - 126 U/L   Total Bilirubin 1.1 0.3 - 1.2 mg/dL   GFR calc non Af Amer >60 >60  mL/min   GFR calc Af Amer >60 >60 mL/min   Anion gap 18 (H) 5 - 15    Comment: Performed at [facility], [address] [place], [place] [address]  Urine rapid drug screen (hosp performed)     Status: Abnormal   Collection Time: 05/01/19  7:04 PM  Result Value Ref Range   Opiates NONE DETECTED NONE DETECTED   Cocaine POSITIVE (A) NONE DETECTED   Benzodiazepines NONE DETECTED NONE DETECTED   Amphetamines NONE DETECTED NONE DETECTED   Tetrahydrocannabinol NONE DETECTED NONE DETECTED   Barbiturates NONE DETECTED NONE DETECTED    Comment: (NOTE) DRUG SCREEN FOR MEDICAL PURPOSES ONLY.  IF CONFIRMATION IS NEEDED FOR ANY PURPOSE, NOTIFY LAB WITHIN 5 DAYS. LOWEST DETECTABLE LIMITS FOR URINE DRUG SCREEN Drug Class                     Cutoff (ng/mL) Amphetamine and metabolites    1000 Barbiturate and metabolites    200 Benzodiazepine                 200 Tricyclics and metabolites     300 Opiates and metabolites        300 Cocaine and metabolites        300 THC                            50 Performed at [facility], [address] [place], [place] [address]   Urinalysis, Routine w reflex microscopic     Status: Abnormal   Collection Time: 05/01/19  7:04 PM  Result Value Ref Range   Color, Urine AMBER (A) YELLOW    Comment: BIOCHEMICALS MAY BE AFFECTED BY COLOR   APPearance CLEAR CLEAR   Specific Gravity, Urine 1.024 1.005 - 1.030   pH 5.0 5.0 - 8.0   Glucose, UA 50 (A) NEGATIVE mg/dL   Hgb urine dipstick NEGATIVE NEGATIVE   Bilirubin Urine NEGATIVE NEGATIVE   Ketones, ur 20 (A) NEGATIVE mg/dL   Protein, ur 30 (A) NEGATIVE mg/dL   Nitrite NEGATIVE NEGATIVE   Leukocytes,Ua TRACE (A) NEGATIVE   RBC / HPF 0-5 0 - 5 RBC/hpf   WBC, UA 21-50 0 - 5 WBC/hpf   Bacteria, UA NONE  SEEN NONE SEEN   Squamous Epithelial / LPF 0-5 0 - 5   Mucus PRESENT    Hyaline Casts, UA PRESENT     Comment: Performed at [facility], [address], [place], [place] [address]  I-stat chem 8, ED     Status: Abnormal   Collection Time: 05/01/19  7:11 PM  Result Value Ref Range   Sodium 139 135 - 145 mmol/L   Potassium 2.9 (L) 3.5 - 5.1 mmol/L   Chloride 95 (L) 98 - 111 mmol/L   BUN 6 6 - 20 mg/dL   Creatinine, Ser 6.73 0.61 - 1.24 mg/dL   Glucose, Bld 419 (H) 70 - 99 mg/dL   Calcium, Ion 3.79 (L) 1.15 - 1.40 mmol/L   TCO2 30 22 - 32 mmol/L   Hemoglobin 16.7 13.0 - 17.0 g/dL   HCT 02.4 09.7 - 35.3 %   Ct Head Code Stroke Wo Contrast  Result Date: 05/01/2019 CLINICAL DATA:  Code stroke. Left sided arm numbness beginning 1800 hours EXAM: CT HEAD WITHOUT CONTRAST TECHNIQUE: Contiguous axial images were obtained from the base of the skull through the vertex without intravenous contrast. COMPARISON:  None. FINDINGS: Brain: Normal appearance without evidence of atrophy, old or acute infarction, mass lesion, hemorrhage, hydrocephalus or extra-axial collection. Vascular: No abnormal vascular finding. Skull: Negative Sinuses/Orbits: Clear/normal Other: Sebaceous cysts of the scalp. ASPECTS [facility] Stroke Program Early CT Score) - Ganglionic level infarction (caudate, lentiform nuclei, internal capsule, insula, M1-M3 cortex): 7 - Supraganglionic infarction (M4-M6 cortex): 3 Total score (0-10 with 10 being normal): 10 IMPRESSION: 1. No acute CT finding.  Normal appearance of the brain. 2. ASPECTS is 10. 3. These results were called by telephone at the time of interpretation on 05/01/2019 at 7:16 pm to Dr. Raeford Razor , who verbally acknowledged these results. Electronically Signed   By: Paulina Fusi M.D.   On: 05/01/2019 19:18    Pending Labs [employer] (From admission, onward)    Start     Ordered   Signed and Held  HIV antibody (Routine Testing)  Once,   R     Signed and Held   Signed and Held  Hemoglobin A1c  Tomorrow morning,   R     Signed and Held   Signed and Held  Lipid panel  Tomorrow morning,   R    Comments: Fasting     Signed and Held   Signed and Held  CBC  (enoxaparin (LOVENOX)    CrCl >/= 30 ml/min)  Once,   R    Comments: Baseline for enoxaparin therapy IF NOT ALREADY DRAWN.  Notify MD if PLT < 100 K.    Signed and Held   Signed and Held  Creatinine, serum  (enoxaparin (LOVENOX)    CrCl >/= 30 ml/min)  Once,   R    Comments: Baseline for enoxaparin therapy IF NOT ALREADY DRAWN.    Signed and Held   Signed and Held  Creatinine, serum  (enoxaparin (LOVENOX)    CrCl >/= 30 ml/min)  Weekly,   R    Comments: while on enoxaparin therapy    Signed and Held   Signed and Held  CBC  Tomorrow morning,   R     Signed and Held   Signed and Held  Comprehensive metabolic panel  Tomorrow morning,   R     Signed and Held          Vitals/Pain Today's Vitals   05/01/19  2115 05/01/19 2130 05/01/19 2230 05/01/19 2300  BP: (!) 144/86 130/86 (!) 141/92 (!) 136/91  Pulse: (!) 102 (!) 102 98 98  Resp: 17 11 (!) 22 19  Temp:      TempSrc:      SpO2: 96% 97% 97% 96%  Weight:      PainSc:        Isolation Precautions No active isolations  Medications Medications  potassium chloride SA (K-DUR) CR tablet 40 mEq (40 mEq Oral Given 05/01/19 2003)  LORazepam (ATIVAN) injection 2 mg (2 mg Intravenous Given 05/01/19 2004)  thiamine (VITAMIN B-1) tablet 100 mg (100 mg Oral Given 05/01/19 2004)  multivitamin with minerals tablet 1 tablet (1 tablet Oral Given 05/01/19 2003)    Mobility walks

## 2019-05-02 ENCOUNTER — Encounter (HOSPITAL_COMMUNITY): Payer: BLUE CROSS/BLUE SHIELD

## 2019-05-02 ENCOUNTER — Inpatient Hospital Stay (HOSPITAL_COMMUNITY): Payer: BLUE CROSS/BLUE SHIELD

## 2019-05-02 DIAGNOSIS — R200 Anesthesia of skin: Secondary | ICD-10-CM

## 2019-05-02 DIAGNOSIS — R739 Hyperglycemia, unspecified: Secondary | ICD-10-CM

## 2019-05-02 DIAGNOSIS — N390 Urinary tract infection, site not specified: Secondary | ICD-10-CM | POA: Diagnosis present

## 2019-05-02 DIAGNOSIS — F1410 Cocaine abuse, uncomplicated: Secondary | ICD-10-CM | POA: Diagnosis present

## 2019-05-02 DIAGNOSIS — F10230 Alcohol dependence with withdrawal, uncomplicated: Secondary | ICD-10-CM

## 2019-05-02 DIAGNOSIS — E785 Hyperlipidemia, unspecified: Secondary | ICD-10-CM

## 2019-05-02 DIAGNOSIS — E876 Hypokalemia: Secondary | ICD-10-CM

## 2019-05-02 LAB — COMPREHENSIVE METABOLIC PANEL
ALT: 31 U/L (ref 0–44)
AST: 40 U/L (ref 15–41)
Albumin: 3.9 g/dL (ref 3.5–5.0)
Alkaline Phosphatase: 59 U/L (ref 38–126)
Anion gap: 12 (ref 5–15)
BUN: 5 mg/dL — ABNORMAL LOW (ref 6–20)
CO2: 30 mmol/L (ref 22–32)
Calcium: 9.3 mg/dL (ref 8.9–10.3)
Chloride: 96 mmol/L — ABNORMAL LOW (ref 98–111)
Creatinine, Ser: 0.73 mg/dL (ref 0.61–1.24)
GFR calc Af Amer: >60 mL/min (ref >60)
GFR calc non Af Amer: >60 mL/min (ref >60)
Glucose, Bld: 96 mg/dL (ref 70–99)
Potassium: 2.7 mmol/L — CL (ref 3.5–5.1)
Sodium: 138 mmol/L (ref 135–145)
Total Bilirubin: 1.6 mg/dL — ABNORMAL HIGH (ref 0.3–1.2)
Total Protein: 6.8 g/dL (ref 6.5–8.1)

## 2019-05-02 LAB — MAGNESIUM: Magnesium: 2 mg/dL (ref 1.7–2.4)

## 2019-05-02 LAB — CBC WITH DIFFERENTIAL/PLATELET
Abs Immature Granulocytes: 0.01 10*3/uL (ref 0.00–0.07)
Basophils Absolute: 0 10*3/uL (ref 0.0–0.1)
Basophils Relative: 1 %
Eosinophils Absolute: 0 10*3/uL (ref 0.0–0.5)
Eosinophils Relative: 1 %
HCT: 42.9 % (ref 39.0–52.0)
Hemoglobin: 15.2 g/dL (ref 13.0–17.0)
Immature Granulocytes: 0 %
Lymphocytes Relative: 25 %
Lymphs Abs: 1.4 10*3/uL (ref 0.7–4.0)
MCH: 33.8 pg (ref 26.0–34.0)
MCHC: 35.4 g/dL (ref 30.0–36.0)
MCV: 95.3 fL (ref 80.0–100.0)
Monocytes Absolute: 0.7 10*3/uL (ref 0.1–1.0)
Monocytes Relative: 13 %
Neutro Abs: 3.4 10*3/uL (ref 1.7–7.7)
Neutrophils Relative %: 60 %
Platelets: 230 10*3/uL (ref 150–400)
RBC: 4.5 MIL/uL (ref 4.22–5.81)
RDW: 12.9 % (ref 11.5–15.5)
WBC: 5.5 10*3/uL (ref 4.0–10.5)
nRBC: 0 % (ref 0.0–0.2)

## 2019-05-02 LAB — LIPID PANEL
Cholesterol: 194 mg/dL (ref 0–200)
HDL: 87 mg/dL (ref >40)
LDL Cholesterol: 94 mg/dL (ref 0–99)
Total CHOL/HDL Ratio: 2.2 RATIO
Triglycerides: 67 mg/dL (ref <150)
VLDL: 13 mg/dL (ref 0–40)

## 2019-05-02 LAB — HEMOGLOBIN A1C
Hgb A1c MFr Bld: 5.1 % (ref 4.8–5.6)
Mean Plasma Glucose: 99.67 mg/dL

## 2019-05-02 LAB — SARS CORONAVIRUS 2 (TAT 6-24 HRS): SARS Coronavirus 2: NEGATIVE

## 2019-05-02 MED ORDER — ENOXAPARIN SODIUM 40 MG/0.4ML ~~LOC~~ SOLN
40.0000 mg | SUBCUTANEOUS | Status: DC
  Administered 2019-05-02 – 2019-05-03 (×2): 40 mg via SUBCUTANEOUS
  Filled 2019-05-02 (×3): qty 0.4

## 2019-05-02 MED ORDER — ADULT MULTIVITAMIN W/MINERALS CH
1.0000 | ORAL_TABLET | Freq: Every day | ORAL | Status: DC
  Administered 2019-05-02 – 2019-05-03 (×2): 1 via ORAL
  Filled 2019-05-02 (×2): qty 1

## 2019-05-02 MED ORDER — IOHEXOL 350 MG/ML SOLN
75.0000 mL | Freq: Once | INTRAVENOUS | Status: AC | PRN
  Administered 2019-05-02: 11:00:00 75 mL via INTRAVENOUS

## 2019-05-02 MED ORDER — ACETAMINOPHEN 650 MG RE SUPP: 650.0000 mg | RECTAL | Status: DC | PRN

## 2019-05-02 MED ORDER — ATORVASTATIN CALCIUM 10 MG PO TABS: 20.0000 mg | ORAL_TABLET | Freq: Every day | ORAL | Status: DC

## 2019-05-02 MED ORDER — VITAMIN B-1 100 MG PO TABS
100.0000 mg | ORAL_TABLET | Freq: Every day | ORAL | Status: DC
  Administered 2019-05-03: 100 mg via ORAL
  Filled 2019-05-02: qty 1

## 2019-05-02 MED ORDER — THIAMINE HCL 100 MG/ML IJ SOLN
100.0000 mg | Freq: Every day | INTRAMUSCULAR | Status: DC
  Administered 2019-05-02: 10:00:00 100 mg via INTRAVENOUS
  Filled 2019-05-02: qty 2

## 2019-05-02 MED ORDER — FOLIC ACID 1 MG PO TABS
1.0000 mg | ORAL_TABLET | Freq: Every day | ORAL | Status: DC
  Administered 2019-05-02 – 2019-05-03 (×2): 1 mg via ORAL
  Filled 2019-05-02 (×2): qty 1

## 2019-05-02 MED ORDER — ACETAMINOPHEN 160 MG/5ML PO SOLN: 650.0000 mg | ORAL | Status: DC | PRN

## 2019-05-02 MED ORDER — LORAZEPAM 1 MG PO TABS
1.0000 mg | ORAL_TABLET | Freq: Four times a day (QID) | ORAL | Status: DC | PRN
  Filled 2019-05-02: qty 1

## 2019-05-02 MED ORDER — ASPIRIN EC 325 MG PO TBEC
325.0000 mg | DELAYED_RELEASE_TABLET | Freq: Every day | ORAL | Status: DC
  Administered 2019-05-02: 11:00:00 325 mg via ORAL
  Filled 2019-05-02: qty 1

## 2019-05-02 MED ORDER — LORAZEPAM 2 MG/ML IJ SOLN
1.0000 mg | Freq: Four times a day (QID) | INTRAMUSCULAR | Status: DC | PRN
  Administered 2019-05-02: 1 mg via INTRAVENOUS
  Filled 2019-05-02: qty 1

## 2019-05-02 MED ORDER — SODIUM CHLORIDE 0.9 % IV SOLN
INTRAVENOUS | Status: DC
  Administered 2019-05-02 – 2019-05-03 (×2): via INTRAVENOUS

## 2019-05-02 MED ORDER — ASPIRIN EC 81 MG PO TBEC
81.0000 mg | DELAYED_RELEASE_TABLET | Freq: Every day | ORAL | Status: DC
  Administered 2019-05-03: 09:00:00 81 mg via ORAL
  Filled 2019-05-02: qty 1

## 2019-05-02 MED ORDER — SENNOSIDES-DOCUSATE SODIUM 8.6-50 MG PO TABS: 1.0000 | ORAL_TABLET | Freq: Every evening | ORAL | Status: DC | PRN

## 2019-05-02 MED ORDER — ASPIRIN 300 MG RE SUPP: 300.0000 mg | Freq: Every day | RECTAL | Status: DC

## 2019-05-02 MED ORDER — CLOPIDOGREL BISULFATE 75 MG PO TABS
75.0000 mg | ORAL_TABLET | Freq: Every day | ORAL | Status: DC
  Administered 2019-05-03: 09:00:00 75 mg via ORAL
  Filled 2019-05-02: qty 1

## 2019-05-02 MED ORDER — STROKE: EARLY STAGES OF RECOVERY BOOK
Freq: Once | Status: AC
  Administered 2019-05-02: 06:00:00
  Filled 2019-05-02 (×2): qty 1

## 2019-05-02 MED ORDER — POTASSIUM CHLORIDE CRYS ER 20 MEQ PO TBCR: 40.0000 meq | EXTENDED_RELEASE_TABLET | ORAL | Status: AC

## 2019-05-02 MED ORDER — ACETAMINOPHEN 325 MG PO TABS: 650.0000 mg | ORAL_TABLET | ORAL | Status: DC | PRN

## 2019-05-02 NOTE — Progress Notes (Signed)
Patient ID: Walter Harper, male   DOB: 03/05/1976, 43 y.o.   MRN: [id number]  PROGRESS NOTE    Walter Harper  NAT:[id number]:[id number] DOB: 08/04/1976 DOA: 05/01/2019 PCP: Patient, No Pcp Per   Brief Narrative:  43 year old male with history of alcohol abuse presented on 05/01/2019 with left arm numbness.  CT of the head was negative for acute intracranial abnormality.  Urine drug screen was positive for cocaine.  Neurology was consulted.  Assessment & Plan:  Left arm numbness with suspicion for CVA -Initial CT of the head was negative for acute intracranial abnormality.  MRI of the brain is pending.  Follow echocardiogram and carotid Doppler.  Neurology following. -Still complains of some numbness in the left upper extremity. -PT/OT/SLP evaluation -Allow for permissive hypertension -For hemoglobin A1c and lipid panel  History of alcohol abuse -Alcohol level was less than 10 on admission.  Continue CIWA protocol.  Slightly tremorous this morning and received Ativan.  Continue multivitamin, thiamine, folic acid.  Social worker consult.  Cocaine abuse -Counseled regarding cessation.  Social worker consult  Hypokalemia -Replace.  Repeat a.m. labs  DVT prophylaxis: Lovenox Code Status: Full Family Communication: Spoke with patient.  No family at bedside Disposition Plan: Home once cleared by neurology  Consultants: Neurology  Procedures: None  Antimicrobials: None   Subjective: Patient seen and examined at bedside.  He is slightly slow to respond to questions but answers some.  He apparently received Ativan this morning.  Still complains of some left upper extremity numbness.  No overnight fever or vomiting reported by nursing staff  Objective: Vitals:   05/02/19 0440 05/02/19 0441 05/02/19 0643 05/02/19 0823  BP: 128/88 128/88 (!) 146/88 (!) 142/91  Pulse: 82 68 (!) 102 83  Resp: 17 16 14 14   Temp: 99 F (37.2 C) 99 F (37.2 C) 98.7 F (37.1 C) 99.2 F (37.3 C)   TempSrc: Oral Oral Oral Oral  SpO2: 100% 98% 100% 99%  Weight:       No intake or output data in the 24 hours ending 05/02/19 0954 Filed Weights   05/01/19 1904  Weight: 90.7 kg    Examination:  General exam: Appears calm and comfortable.  Slightly slow to respond to questions. Respiratory system: Bilateral decreased breath sounds at bases Cardiovascular system: S1 & S2 heard, intermittently tachycardic gastrointestinal system: Abdomen is nondistended, soft and nontender. Normal bowel sounds heard. Extremities: No cyanosis, clubbing, edema  Central nervous system: Awake but slow to respond.  No focal neurological deficits. Moving extremities   Data Reviewed: I have personally reviewed following labs and imaging studies  CBC: Recent Labs  Lab 05/01/19 1904 05/01/19 1911 05/02/19 0825  WBC 9.8  --  5.5  NEUTROABS 8.1*  --  3.4  HGB 16.3 16.7 15.2  HCT 46.5 49.0 42.9  MCV 95.1  --  95.3  PLT 268  --  230   Basic Metabolic Panel: Recent Labs  Lab 05/01/19 1904 05/01/19 1911 05/02/19 0825  NA 140 139 138  K 2.8* 2.9* 2.7*  CL 94* 95* 96*  CO2 28  --  30  GLUCOSE 123* 122* 96  BUN 8 6 5*  CREATININE 0.82 0.70 0.73  CALCIUM 9.7  --  9.3  MG  --   --  2.0   GFR: CrCl cannot be calculated (Unknown ideal weight.). Liver Function Tests: Recent Labs  Lab 05/01/19 1904 05/02/19 0825  AST 58* 40  ALT 39 31  ALKPHOS 71 59  BILITOT 1.1  1.6*  PROT 8.4* 6.8  ALBUMIN 5.0 3.9   No results for input(s): LIPASE, AMYLASE in the last 168 hours. No results for input(s): AMMONIA in the last 168 hours. Coagulation Profile: Recent Labs  Lab 05/01/19 1904  INR 1.0   Cardiac Enzymes: No results for input(s): CKTOTAL, CKMB, CKMBINDEX, TROPONINI in the last 168 hours. BNP (last 3 results) No results for input(s): PROBNP in the last 8760 hours. HbA1C: No results for input(s): HGBA1C in the last 72 hours. CBG: No results for input(s): GLUCAP in the last 168 hours. Lipid  Profile: No results for input(s): CHOL, HDL, LDLCALC, TRIG, CHOLHDL, LDLDIRECT in the last 72 hours. Thyroid Function Tests: No results for input(s): TSH, T4TOTAL, FREET4, T3FREE, THYROIDAB in the last 72 hours. Anemia Panel: No results for input(s): VITAMINB12, FOLATE, FERRITIN, TIBC, IRON, RETICCTPCT in the last 72 hours. Sepsis Labs: No results for input(s): PROCALCITON, LATICACIDVEN in the last 168 hours.  No results found for this or any previous visit (from the past 240 hour(s)).       Radiology Studies: Ct Head Code Stroke Wo Contrast  Result Date: 05/01/2019 CLINICAL DATA:  Code stroke. Left sided arm numbness beginning 1800 hours EXAM: CT HEAD WITHOUT CONTRAST TECHNIQUE: Contiguous axial images were obtained from the base of the skull through the vertex without intravenous contrast. COMPARISON:  None. FINDINGS: Brain: Normal appearance without evidence of atrophy, old or acute infarction, mass lesion, hemorrhage, hydrocephalus or extra-axial collection. Vascular: No abnormal vascular finding. Skull: Negative Sinuses/Orbits: Clear/normal Other: Sebaceous cysts of the scalp. ASPECTS [facility] Stroke Program Early CT Score) - Ganglionic level infarction (caudate, lentiform nuclei, internal capsule, insula, M1-M3 cortex): 7 - Supraganglionic infarction (M4-M6 cortex): 3 Total score (0-10 with 10 being normal): 10 IMPRESSION: 1. No acute CT finding.  Normal appearance of the brain. 2. ASPECTS is 10. 3. These results were called by telephone at the time of interpretation on 05/01/2019 at 7:16 pm to Dr. Virgel Manifold , who verbally acknowledged these results. Electronically Signed   By: Nelson Chimes M.D.   On: 05/01/2019 19:18        Scheduled Meds: . aspirin EC  325 mg Oral Daily   Or  . aspirin  300 mg Rectal Daily  . enoxaparin (LOVENOX) injection  40 mg Subcutaneous Q24H  . folic acid  1 mg Oral Daily  . multivitamin with minerals  1 tablet Oral Daily  . thiamine  100 mg Oral  Daily   Or  . thiamine  100 mg Intravenous Daily   Continuous Infusions: . sodium chloride 75 mL/hr at 05/02/19 0457     LOS: 1 day        Aline August, MD [facility] 05/02/2019, 9:54 AM

## 2019-05-02 NOTE — Progress Notes (Signed)
OT Cancellation Note  Patient Details Name: Walter Harper MRN: [id number] DOB: [birth year]   Cancelled Treatment:    Reason Eval/Treat Not Completed: Patient at procedure or test/ unavailable. Will attempt to return as schedule allows.  Simonne Come 05/02/2019, 10:31 AM

## 2019-05-02 NOTE — Evaluation (Signed)
Speech Language Pathology Evaluation Patient Details Name: Tomi BambergerChristopher T Ishida MRN: [id number] DOB: 03/25/1976 Today's Date: 05/02/2019 Time: [id number] SLP Time Calculation (min) (ACUTE ONLY): 24 min  Problem List:  Patient Active Problem List   Diagnosis Date Noted  . Cocaine abuse (HCC) 05/02/2019  . Acute lower UTI 05/02/2019  . Acute cerebrovascular accident (CVA) (HCC) 05/01/2019  . Alcohol dependence with uncomplicated withdrawal (HCC) 05/01/2019  . Hypokalemia 05/01/2019  . Hyperglycemia 05/01/2019   Past Medical History: History reviewed. No pertinent past medical history. Past Surgical History: History reviewed. No pertinent surgical history. HPI:  Pt is a 43 y.o. male with medical history significant of alcohol abuse who presented to the ER with left upper extremity numbness and weakness. TPA was offered but pt declined. CT was negative for acute changes. Urine drug screen was positive for cocaine   Assessment / Plan / Recommendation Clinical Impression  Pt reported that he is currently unemployed and was living independently with his family prior to admission. He stated that he has a high-school education and has had difficulty with memory and attention since he was in school. Per the pt he typically uses external memory aids to compensate for this. He denied any language deficits and stated that his motor speech skills have now returned to baseline.   His speech and language skills are currently within normal limits. Additional processing time was inconsistently required during the assessment and he did demonstrate some difficulty with memory. However, his cognitive-linguistic skills were functional and the impact of his receipt of Ativan this morning on his processing speed is considered. Further skilled SLP services are not clinically indicated at this time. Pt and nursing were educated regarding results and recommendations and both parties verbalized understanding as well as  agreement with plan of care.    SLP Assessment  SLP Recommendation/Assessment: Patient does not need any further Speech Lanaguage Pathology Services SLP Visit Diagnosis: Cognitive communication deficit (R41.841)    Follow Up Recommendations  None    Frequency and Duration           SLP Evaluation Cognition  Overall Cognitive Status: History of cognitive impairments - at baseline Arousal/Alertness: Awake/alert Orientation Level: Oriented X4 Attention: Focused;Sustained;Selective Focused Attention: Appears intact(Vigilance WNL: 1/1) Sustained Attention: Appears intact(Serial 7s: 3/3) Selective Attention: Appears intact Memory: Impaired Memory Impairment: Storage deficit;Retrieval deficit;Decreased recall of new information(Immediate: 5/5; dealyed: 0/5; with cues: 5/5) Problem Solving: Appears intact Executive Function: Reasoning;Organizing Organizing: (Backward digit span: 1/1)       Comprehension  Auditory Comprehension Overall Auditory Comprehension: Appears within functional limits for tasks assessed Yes/No Questions: Within Functional Limits Basic Biographical Questions: (5/5) Complex Questions: (4/5) Paragraph Comprehension (via yes/no questions): (3/4) Commands: Within Functional Limits Two Step Basic Commands: (4/4) Multistep Basic Commands: (3/3) Conversation: Complex Interfering Components: Attention;Processing speed Visual Recognition/Discrimination Discrimination: Within Function Limits Reading Comprehension Reading Status: Within funtional limits    Expression Expression Primary Mode of Expression: Verbal Verbal Expression Overall Verbal Expression: Appears within functional limits for tasks assessed Initiation: No impairment Automatic Speech: Counting;Day of week;Month of year(WNL) Level of Generative/Spontaneous Verbalization: Conversation Repetition: No impairment Naming: No impairment Responsive: (5/5) Confrontation: Within functional  limits(10/10) Divergent: (1/1) Pragmatics: No impairment   Oral / Motor  Oral Motor/Sensory Function Overall Oral Motor/Sensory Function: Mild impairment Facial ROM: Reduced left;Suspected CN VII (facial) dysfunction Facial Symmetry: Abnormal symmetry left;Suspected CN VII (facial) dysfunction Facial Strength: Reduced left;Suspected CN VII (facial) dysfunction Facial Sensation: Within Functional Limits Lingual ROM: Within Functional Limits Lingual Symmetry:  Within Functional Limits Lingual Strength: Within Functional Limits Lingual Sensation: Within Functional Limits Motor Speech Overall Motor Speech: Appears within functional limits for tasks assessed Respiration: Within functional limits Phonation: Normal Resonance: Within functional limits Articulation: Within functional limitis Intelligibility: Intelligible Motor Planning: Witnin functional limits Motor Speech Errors: Not applicable   Genny Caulder I. Hardin Negus, [place], [place] Office number [phone] Pager [phone]                    Horton Marshall 05/02/2019, 12:24 PM

## 2019-05-02 NOTE — Evaluation (Signed)
Occupational Therapy Evaluation Patient Details Name: ODUS CLASBY MRN: [id number] DOB: 03-12-1976 Today's Date: 05/02/2019    History of Present Illness 43 y.o. male with medical history significant of alcohol abuse, no significant cardiac history who presented to the ER with left upper extremity numbness and weakness.  Symptoms started around 5 PM today.  They were persistent when he arrived the ER about 2 hours later.  He was offered TPA but declined.  No known trauma no any other injuries.  In the ER he was seen by tele-neurology and recommended stroke work-up out.  He still has lingering numbness on the left lower extremity but mostly resolved.   Clinical Impression   Pt admitted with above and presents to OT with all numbness and weakness in UE resolved.  Pt completed functional mobility and transfers in room without AD and no physical assistance. Donned shirt in standing without assist.  Pt's wife present and reports pt looking better and no concerns about his physical or cognitive abilities at this time.  Pt with no OT needs at this time.  OT will sign off.    Follow Up Recommendations  No OT follow up    Equipment Recommendations  None recommended by OT       Precautions / Restrictions Precautions Precautions: None      Mobility Bed Mobility Overal bed mobility: Independent                Transfers Overall transfer level: Independent Equipment used: None             General transfer comment: Sit > stand and ambulated around room without AD and no physical assistance        ADL either performed or assessed with clinical judgement   ADL Overall ADL's : At baseline                                       General ADL Comments: Pt donned shirt in standing without LOB.  Completed functional mobility and transfers in room without AD.     Vision Baseline Vision/History: No visual deficits Patient Visual Report: No change from  baseline Vision Assessment?: No apparent visual deficits            Pertinent Vitals/Pain Pain Assessment: No/denies pain     Hand Dominance Right   Extremity/Trunk Assessment Upper Extremity Assessment Upper Extremity Assessment: Overall WFL for tasks assessed           Communication Communication Communication: No difficulties   Cognition Arousal/Alertness: Awake/alert Behavior During Therapy: WFL for tasks assessed/performed Overall Cognitive Status: History of cognitive impairments - at baseline                                 General Comments: kept asking about when he can eat              Home Living Family/patient expects to be discharged to:: Private residence Living Arrangements: Spouse/significant other;Children Available Help at Discharge: Family;Available 24 hours/day Type of Home: House Home Access: Level entry     Home Layout: One level     Bathroom Shower/Tub: Occupational psychologist: Standard Bathroom Accessibility: Yes   Home Equipment: Shower seat - built in      Lives With: Family    Prior Functioning/Environment Level of Independence: Independent  OT Goals(Current goals can be found in the care plan section) Acute Rehab OT Goals Patient Stated Goal: get something to eat OT Goal Formulation: All assessment and education complete, DC therapy     AM-PAC OT "6 Clicks" Daily Activity     Outcome Measure Help from another person eating meals?: None Help from another person taking care of personal grooming?: None Help from another person toileting, which includes using toliet, bedpan, or urinal?: None Help from another person bathing (including washing, rinsing, drying)?: None Help from another person to put on and taking off regular upper body clothing?: None Help from another person to put on and taking off regular lower body clothing?: None 6 Click Score: 24   End of Session  Nurse Communication: Mobility status  Activity Tolerance: Patient tolerated treatment well Patient left: in bed;with call bell/phone within reach;with family/visitor present                   Time: [id number] OT Time Calculation (min): 12 min Charges:  OT General Charges $OT Visit: 1 Visit OT Evaluation $OT Eval Low Complexity: 1 Low   Rosalio LoudHOXIE, Janna Oak, [phone] 05/02/2019, 2:08 PM

## 2019-05-02 NOTE — ED Notes (Signed)
Transport for patient was called. [facility] paperwork at nurses station.

## 2019-05-02 NOTE — ED Notes (Signed)
Pt ambulated to bathroom without assistance 

## 2019-05-02 NOTE — Progress Notes (Signed)
Pt ordered po meds, order for NPO w/meds. Pt able to use straw, sip water, no signs or symptoms of dysphagia noted by this nurse. Pt took po meds with no difficulty. Still has left sided droop to mouth, speech is clear.

## 2019-05-02 NOTE — Progress Notes (Signed)
Pt back from MRI 

## 2019-05-02 NOTE — ED Notes (Signed)
[facility] at bedside to transport patient to [facility]. Patient alert and stable at time of transport.

## 2019-05-02 NOTE — Consult Note (Signed)
Stroke Neurology Consultation Note  Consult Requested by: [facility] (seen by teleneurlogy at [facility])  Reason for Consult: L arm numbness and weakness, rule out stroke   Consult Date:  05/02/19  The history was obtained from the chart and pt.  During history and examination, all items were able to obtain unless otherwise noted.  History of Present Illness:  Walter Harper is an 43 y.o. Caucasian male with PMH of alcohol abuse presented to [facility] ED for left arm weakness and numbness.  He stated that he was at his normal self until 5 PM yesterday when he started to have left arm weakness and numbness, and the same time he felt left leg also mild numbness.  In ER, his left leg numbness resolved, however still has left arm weakness and numbness but also improving.  TPA was offered however given risk and benefit consideration, TPA was declined.  The symptom lasted about 2 to 3 hours and resolved.  He was transferred to [facility] for stroke work-up.  This morning, I saw him in the room, he stated that his symptoms all resolved.  He denies cigarette smoking however he drinks 5-6 double size drinks every day.  He denies cocaine use, however his UDS showed positive cocaine.  He does not know why.  He denies any heroin or THC use.  Date last known well: Date: 05/01/2019 Time last known well: Time: 17:00 tPA Given: No: minor sx, pt refused MRS:  0 NIHSS:  1 at initial assessment  History reviewed. No pertinent past medical history.   History reviewed. No pertinent surgical history.  History reviewed. No pertinent family history.   Social History:  reports that he has never smoked. He has never used smokeless tobacco. He reports current alcohol use. No history on file for drug.  Review of Systems: A full ROS was attempted today and was able to be performed.  Systems assessed include - Constitutional, Eyes, HENT, Respiratory, Cardiovascular, Gastrointestinal, Genitourinary,  Integument/breast, Hematologic/lymphatic, Musculoskeletal, Neurological, Behavioral/Psych, Endocrine, Allergic/Immunologic - with pertinent responses as per HPI.  Allergies: No Known Allergies   Medications:  Prior to Admission:  Medications Prior to Admission  Medication Sig Dispense Refill Last Dose  . cetirizine (ZYRTEC) 10 MG tablet Take 10 mg by mouth daily as needed for allergies.   unknown    Test Results: CBC:  Recent Labs  Lab 05/01/19 1904 05/01/19 1911 05/02/19 0825  WBC 9.8  --  5.5  NEUTROABS 8.1*  --  3.4  HGB 16.3 16.7 15.2  HCT 46.5 49.0 42.9  MCV 95.1  --  95.3  PLT 268  --  230   Basic Metabolic Panel:  Recent Labs  Lab 05/01/19 1904 05/01/19 1911 05/02/19 0825  NA 140 139 138  K 2.8* 2.9* 2.7*  CL 94* 95* 96*  CO2 28  --  30  GLUCOSE 123* 122* 96  BUN 8 6 5*  CREATININE 0.82 0.70 0.73  CALCIUM 9.7  --  9.3  MG  --   --  2.0   Liver Function Tests: Recent Labs  Lab 05/01/19 1904 05/02/19 0825  AST 58* 40  ALT 39 31  ALKPHOS 71 59  BILITOT 1.1 1.6*  PROT 8.4* 6.8  ALBUMIN 5.0 3.9   No results for input(s): LIPASE, AMYLASE in the last 168 hours. No results for input(s): AMMONIA in the last 168 hours. Coagulation Studies:  Recent Labs    05/01/19 1904  LABPROT 12.7  INR 1.0  Cardiac Enzymes: No results for input(s): CKTOTAL, CKMB, CKMBINDEX, TROPONINI in the last 168 hours. BNP: Invalid input(s): POCBNP CBG: No results for input(s): GLUCAP in the last 168 hours. Urinalysis:  Recent Labs  Lab 05/01/19 1904  COLORURINE AMBER*  LABSPEC 1.024  PHURINE 5.0  GLUCOSEU 50*  HGBUR NEGATIVE  BILIRUBINUR NEGATIVE  KETONESUR 20*  PROTEINUR 30*  NITRITE NEGATIVE  LEUKOCYTESUR TRACE*   Microbiology:  Results for orders placed or performed during the hospital encounter of 05/01/19  SARS CORONAVIRUS 2 (TAT 6-12 HRS) Nasal Swab Aptima Multi Swab     Status: None   Collection Time: 05/02/19 12:24 AM   Specimen: Aptima Multi Swab; Nasal  Swab  Result Value Ref Range Status   SARS Coronavirus 2 NEGATIVE NEGATIVE Final    Comment: (NOTE) SARS-CoV-2 target nucleic acids are NOT DETECTED. The SARS-CoV-2 RNA is generally detectable in upper and lower respiratory specimens during the acute phase of infection. Negative results do not preclude SARS-CoV-2 infection, do not rule out co-infections with other pathogens, and should not be used as the sole basis for treatment or other patient management decisions. Negative results must be combined with clinical observations, patient history, and epidemiological information. The expected result is Negative. Fact Sheet for Patients: SugarRoll.be Fact Sheet for Healthcare Providers: https://www.woods-mathews.com/ This test is not yet approved or cleared by the [place] FDA and  has been authorized for detection and/or diagnosis of SARS-CoV-2 by FDA under an Emergency Use Authorization (EUA). This EUA will remain  in effect (meaning this test can be used) for the duration of the COVID-19 declaration under Section 56 4(b)(1) of the Act, 21 U.S.C. section 360bbb-3(b)(1), unless the authorization is terminated or revoked sooner. Performed at [facility], [place] [address], [place], [place] [address]    Lipid Panel:     Component Value Date/Time   CHOL 194 05/02/2019 0825   TRIG 67 05/02/2019 0825   HDL 87 05/02/2019 0825   CHOLHDL 2.2 05/02/2019 0825   VLDL 13 05/02/2019 0825   LDLCALC 94 05/02/2019 0825   HgbA1c:  Lab Results  Component Value Date   HGBA1C 5.1 05/02/2019   Urine Drug Screen:     Component Value Date/Time   LABOPIA NONE DETECTED 05/01/2019 1904   COCAINSCRNUR POSITIVE (A) 05/01/2019 1904   LABBENZ NONE DETECTED 05/01/2019 1904   AMPHETMU NONE DETECTED 05/01/2019 1904   THCU NONE DETECTED 05/01/2019 1904   LABBARB NONE DETECTED 05/01/2019 1904    Alcohol Level:  Recent Labs  Lab 05/01/19 1904  ETH  <10    Ct Angio Head W Or Wo Contrast  Result Date: 05/02/2019 CLINICAL DATA:  Stroke EXAM: CT ANGIOGRAPHY HEAD AND NECK TECHNIQUE: Multidetector CT imaging of the head and neck was performed using the standard protocol during bolus administration of intravenous contrast. Multiplanar CT image reconstructions and MIPs were obtained to evaluate the vascular anatomy. Carotid stenosis measurements (when applicable) are obtained utilizing NASCET criteria, using the distal internal carotid diameter as the denominator. CONTRAST:  17mL OMNIPAQUE IOHEXOL 350 MG/ML SOLN COMPARISON:  CT head 05/01/2019 FINDINGS: CTA NECK FINDINGS Aortic arch: Bovine branching pattern. Normal aortic arch. Proximal great vessels normal. Right carotid system: Normal right carotid. Negative for stenosis atherosclerotic disease or dissection Left carotid system: Normal left carotid. Negative for stenosis atherosclerotic disease or dissection. Vertebral arteries: Normal vertebral bilaterally. Skeleton: Negative Other neck: Negative Upper chest: Negative Review of the MIP images confirms the above findings CTA HEAD FINDINGS Anterior circulation: Cavernous carotid  widely patent bilaterally without stenosis or aneurysm. Anterior and middle cerebral arteries normal bilaterally. Posterior circulation: Both vertebral arteries patent to the basilar. Basilar widely patent. AICA, superior cerebellar, posterior cerebral arteries patent bilaterally without stenosis. Venous sinuses: Normal venous enhancement. Anatomic variants: None Review of the MIP images confirms the above findings IMPRESSION: Normal CTA head neck. No stenosis aneurysm or dissection identified. Normal intracranial circulation. Electronically Signed   By: Marlan Palauharles  Clark M.D.   On: 05/02/2019 11:54   Ct Angio Neck W Or Wo Contrast  Result Date: 05/02/2019 CLINICAL DATA:  Stroke EXAM: CT ANGIOGRAPHY HEAD AND NECK TECHNIQUE: Multidetector CT imaging of the head and neck was performed  using the standard protocol during bolus administration of intravenous contrast. Multiplanar CT image reconstructions and MIPs were obtained to evaluate the vascular anatomy. Carotid stenosis measurements (when applicable) are obtained utilizing NASCET criteria, using the distal internal carotid diameter as the denominator. CONTRAST:  75mL OMNIPAQUE IOHEXOL 350 MG/ML SOLN COMPARISON:  CT head 05/01/2019 FINDINGS: CTA NECK FINDINGS Aortic arch: Bovine branching pattern. Normal aortic arch. Proximal great vessels normal. Right carotid system: Normal right carotid. Negative for stenosis atherosclerotic disease or dissection Left carotid system: Normal left carotid. Negative for stenosis atherosclerotic disease or dissection. Vertebral arteries: Normal vertebral bilaterally. Skeleton: Negative Other neck: Negative Upper chest: Negative Review of the MIP images confirms the above findings CTA HEAD FINDINGS Anterior circulation: Cavernous carotid widely patent bilaterally without stenosis or aneurysm. Anterior and middle cerebral arteries normal bilaterally. Posterior circulation: Both vertebral arteries patent to the basilar. Basilar widely patent. AICA, superior cerebellar, posterior cerebral arteries patent bilaterally without stenosis. Venous sinuses: Normal venous enhancement. Anatomic variants: None Review of the MIP images confirms the above findings IMPRESSION: Normal CTA head neck. No stenosis aneurysm or dissection identified. Normal intracranial circulation. Electronically Signed   By: Marlan Palauharles  Clark M.D.   On: 05/02/2019 11:54   Ct Head Code Stroke Wo Contrast  Result Date: 05/01/2019 CLINICAL DATA:  Code stroke. Left sided arm numbness beginning 1800 hours EXAM: CT HEAD WITHOUT CONTRAST TECHNIQUE: Contiguous axial images were obtained from the base of the skull through the vertex without intravenous contrast. COMPARISON:  None. FINDINGS: Brain: Normal appearance without evidence of atrophy, old or acute  infarction, mass lesion, hemorrhage, hydrocephalus or extra-axial collection. Vascular: No abnormal vascular finding. Skull: Negative Sinuses/Orbits: Clear/normal Other: Sebaceous cysts of the scalp. ASPECTS [facility]([place] Stroke Program Early CT Score) - Ganglionic level infarction (caudate, lentiform nuclei, internal capsule, insula, M1-M3 cortex): 7 - Supraganglionic infarction (M4-M6 cortex): 3 Total score (0-10 with 10 being normal): 10 IMPRESSION: 1. No acute CT finding.  Normal appearance of the brain. 2. ASPECTS is 10. 3. These results were called by telephone at the time of interpretation on 05/01/2019 at 7:16 pm to Dr. Raeford RazorSTEPHEN KOHUT , who verbally acknowledged these results. Electronically Signed   By: Paulina FusiMark  Shogry M.D.   On: 05/01/2019 19:18    EKG: sinus tachycardia, incomplete RBBB.  Physical Examination:   Temp:  [98 F (36.7 C)-99.8 F (37.7 C)] 98.2 F (36.8 C) (08/28 1428) Pulse Rate:  [68-122] 74 (08/28 1428) Resp:  [11-24] 14 (08/28 1428) BP: (122-176)/(85-110) 138/96 (08/28 1428) SpO2:  [93 %-100 %] 100 % (08/28 1428) Weight:  [90.7 kg] 90.7 kg (08/27 1904)  General - Well nourished, well developed, in no apparent distress.  Ophthalmologic - fundi not visualized due to noncooperation.  Cardiovascular - Regular rate and rhythm.  Mental Status -  Level of arousal and orientation to  time, place, and person were intact. Language including expression, naming, repetition, comprehension was assessed and found intact. Attention span and concentration were normal. Fund of Knowledge was assessed and was intact.  Cranial Nerves II - XII - II - Visual field intact OU. III, IV, VI - Extraocular movements intact. V - Facial sensation intact bilaterally. VII - Facial movement intact bilaterally. VIII - Hearing & vestibular intact bilaterally. X - Palate elevates symmetrically. XI - Chin turning & shoulder shrug intact bilaterally. XII - Tongue protrusion intact.  Motor Strength -  The patient's strength was normal in all extremities and pronator drift was absent.  Bulk was normal and fasciculations were absent.   Motor Tone - Muscle tone was assessed at the neck and appendages and was normal.  Reflexes - The patient's reflexes were symmetrical in all extremities and he had no pathological reflexes.  Sensory - Light touch, temperature/pinprick were assessed and were symmetrical.    Coordination - The patient had normal movements in the hands and feet with no ataxia or dysmetria.  Tremor was absent.  Gait and Station - deferred.    Assessment:  Mr. Walter Harper is a 43 y.o. male with history of alcohol abuse presenting to [facility] ED with LUE numbness and weakness. He did not receive IV t-PA due to minor sx, pt refusal.   TIA:  right brain TIA likely secondary to small vessel disease source   Code Stroke CT head No acute stroke. ASPECTS 10.     CTA head & neck normal  MRI  normal  2D Echo pending  LDL 94  HgbA1c 5.1   Lovenox 40 mg sq daily for VTE prophylaxis  No antithrombotic prior to admission, now on aspirin 325 mg daily. Recommend ASA 81 and Plavix 75 mg DAPT for 3 weeks and then ASA alone.  Therapy recommendations:  None  Disposition:  pending   Hypertension  Stable . Permissive hypertension (OK if < 220/120) but gradually normalize in 2-3 days in setting of stroke . Long-term BP goal normotensive  Hyperlipidemia  Home meds:  No statin  LDL 94, goal < 70 for stroke  Add lipitor 20  Continue statin at discharge  Alcohol abuse  5-10 double drinks daily  Educated on no more than 2 drinks a day  CIWA protocol  On FA/MVI/B1  Cocaine abuse  UDS positive for cocaine  Pt decline cocaine use  Education provided  Other Stroke Risk Factors  Obesity, recommend weight loss, diet and exercise as appropriate   Other Active Problems  Hypokalemia   Hospital day # 1   Thank you for this consultation and allowing [place]  to participate in the care of this patient. Neurology will sign off. Please call with questions. Pt will follow up with stroke clinic NP at [facility] in about 4 weeks.   Marvel Plan, MD PhD Stroke Neurology 05/02/2019 4:34 PM    To contact Stroke Continuity provider, please refer to [email or web address]. After hours, contact General Neurology

## 2019-05-02 NOTE — Progress Notes (Signed)
Pt admitted to [facility] from [facility] ED via [facility] in stable condition.  Pt is alert and oriented X4.  VS are stable.  Pt oriented to floor and put on telemetry.  Skin in good condition.  Does have a cyst on back of head that patient says has been there for a few years.  Also has a cyst on back.  Will continue to monitor patient closely.

## 2019-05-02 NOTE — Evaluation (Signed)
Physical Therapy Evaluation Patient Details Name: Walter Harper MRN: [id number] DOB: 09-17-1975 Today's Date: 05/02/2019   History of Present Illness  43 y.o. male with medical history significant of alcohol abuse, no significant cardiac history who presented to the ER with left upper extremity numbness and weakness.  Symptoms started around 5 PM today.  They were persistent when he arrived the ER about 2 hours later.  He was offered TPA but declined.  No known trauma no any other injuries.  In the ER he was seen by tele-neurology and recommended stroke work-up out.  He still has lingering numbness on the left lower extremity but mostly resolved.    Clinical Impression  Pt did well walking in the halls - no loss of balance.  Pt just feeling weak as hasnt eaten in days he reports.  Pt given some exercises to do at DC - esp ways to work on his kyphotic posture at such an early age.  Education complete - wife present for education. All PT goals met.    Follow Up Recommendations No PT follow up;Supervision for mobility/OOB    Equipment Recommendations  None recommended by PT    Recommendations for Other Services       Precautions / Restrictions Precautions Precautions: None Precaution Comments: pt denies falls but reports he does have history of drinking a lot      Mobility  Bed Mobility Overal bed mobility: Independent                Transfers Overall transfer level: Independent Equipment used: None             General transfer comment: Sit > stand and ambulated around room without AD and no physical assistance  Ambulation/Gait Ambulation/Gait assistance: Independent Gait Distance (Feet): 300 Feet Assistive device: None Gait Pattern/deviations: WFL(Within Functional Limits);Trunk flexed     General Gait Details: pt with no loss of balance - pt has kyphotic posture.  pt educated on ways to strenghten his upper back - pt has no back pain and didnt seem  interested in my education  Stairs            Wheelchair Mobility    Modified Rankin (Stroke Patients Only)       Balance                                 Standardized Balance Assessment Standardized Balance Assessment : Berg Balance Test Berg Balance Test Sit to Stand: Able to stand without using hands and stabilize independently Standing Unsupported: Able to stand safely 2 minutes Sitting with Back Unsupported but Feet Supported on Floor or Stool: Able to sit safely and securely 2 minutes Stand to Sit: Sits safely with minimal use of hands Transfers: Able to transfer safely, minor use of hands Standing Unsupported with Eyes Closed: Able to stand 10 seconds safely Standing Ubsupported with Feet Together: Able to place feet together independently and stand 1 minute safely From Standing, Reach Forward with Outstretched Arm: Can reach confidently >25 cm (10") From Standing Position, Pick up Object from Floor: Able to pick up shoe safely and easily From Standing Position, Turn to Look Behind Over each Shoulder: Looks behind from both sides and weight shifts well Turn 360 Degrees: Able to turn 360 degrees safely one side only in 4 seconds or less Standing Unsupported, Alternately Place Feet on Step/Stool: Able to stand independently and complete 8 steps >20 seconds  Standing Unsupported, One Foot in Front: Able to plae foot ahead of the other independently and hold 30 seconds Standing on One Leg: Able to lift leg independently and hold 5-10 seconds Total Score: 52         Pertinent Vitals/Pain Pain Assessment: No/denies pain    Home Living Family/patient expects to be discharged to:: Private residence Living Arrangements: Spouse/significant other;Children Available Help at Discharge: Family;Available 24 hours/day Type of Home: House Home Access: Level entry     Home Layout: One level Home Equipment: Shower seat - built in      Prior Function Level of  Independence: Independent               Hand Dominance   Dominant Hand: Right    Extremity/Trunk Assessment   Upper Extremity Assessment Upper Extremity Assessment: Defer to OT evaluation    Lower Extremity Assessment Lower Extremity Assessment: Overall WFL for tasks assessed    Cervical / Trunk Assessment Cervical / Trunk Assessment: Kyphotic  Communication   Communication: No difficulties  Cognition Arousal/Alertness: Awake/alert Behavior During Therapy: WFL for tasks assessed/performed Overall Cognitive Status: History of cognitive impairments - at baseline                                 General Comments: pt denies any deficits from CVA      General Comments      Exercises Other Exercises Other Exercises: I had pt do sit to stands - he leads the movement with his head/upper trunk flexion.  I educated him on erect body and bending at his hips - not natural movement for him at all.  I had him do 5 sit to stands - this way - improved posture and upper trunk extension.  wife present for education.  encouraged pt to do this daily   Assessment/Plan    PT Assessment Patent does not need any further PT services  PT Problem List         PT Treatment Interventions      PT Goals (Current goals can be found in the Care Plan section)  Acute Rehab PT Goals Patient Stated Goal: get something to eat PT Goal Formulation: With patient/family Time For Goal Achievement: 05/02/19 Potential to Achieve Goals: Good    Frequency     Barriers to discharge        Co-evaluation               AM-PAC PT "6 Clicks" Mobility  Outcome Measure Help needed turning from your back to your side while in a flat bed without using bedrails?: None Help needed moving from lying on your back to sitting on the side of a flat bed without using bedrails?: None Help needed moving to and from a bed to a chair (including a wheelchair)?: None Help needed standing up from a  chair using your arms (e.g., wheelchair or bedside chair)?: None Help needed to walk in hospital room?: None Help needed climbing 3-5 steps with a railing? : A Little 6 Click Score: 23    End of Session Equipment Utilized During Treatment: Gait belt Activity Tolerance: Patient tolerated treatment well Patient left: in bed;with family/visitor present;with call bell/phone within reach Nurse Communication: Mobility status PT Visit Diagnosis: Muscle weakness (generalized) (M62.81)    Time: 1350-1410 PT Time Calculation (min) (ACUTE ONLY): 20 min   Charges:   PT Evaluation $PT Eval Low Complexity: 1 Low  PT Treatments $Gait Training: 8-22 mins        05/02/2019   Rande Lawman, PT   Loyal Buba 05/02/2019, 2:23 PM

## 2019-05-02 NOTE — Progress Notes (Addendum)
Pt assessed: will be on q2 hour VS/neuro checks until 1400 today per orders.  Pt with mildly slurred speech and left sided facial droop in mouth that does not reach eyebrows. Pt did receive Ativan at ~0630 for drenching sweat and tremor. Pt on CIWA monitoring as well.  Pt reports no pain, but does touch own left arm. He reports it 'felt odd' yesterday and gives contradictory answers about whether it is currently numb. Pt does appear to feel light touch on arm.  Pt's mother called (number on chart), updated. She wanted to make sure we knew about pt's drinking alcohol.  Discussed plan of care with patient, will reinforce throughout the day.

## 2019-05-02 NOTE — Procedures (Signed)
Echo attempted. Patient in MRI. Will attempt again.

## 2019-05-02 NOTE — Progress Notes (Addendum)
Pt had yellow MEWS momentarily due to combination of respiratory rate at 1300 (RR 12) and previous orientation level of waking only to voice. At the time of assessment, pt was ALERT and ORIENTATED x4. Will increased VS frequency per MEWS recommendations due to stroke concern and CIWA protocol.  Vital Signs MEWS/VS Documentation      05/02/2019 0823 05/02/2019 1023 05/02/2019 1300 05/02/2019 1428   MEWS Score:  0  1  1  0   MEWS Score Color:  Green  Green  Green  Green   Resp:  14  -  12  14   Pulse:  83  91  82  74   BP:  (!) 142/91  128/85  (!) 147/92  (!) 138/96   Temp:  99.2 F (37.3 C)  98.4 F (36.9 C)  98 F (36.7 C)  98.2 F (36.8 C)   O2 Device:  Room Biomedical scientist  Room Air   Level of Consciousness:  -  Responds to Voice  Alert  -           Barnetta Chapel  Jahmir Salo 05/02/2019,2:44 PM

## 2019-05-03 ENCOUNTER — Inpatient Hospital Stay (HOSPITAL_COMMUNITY): Payer: BLUE CROSS/BLUE SHIELD

## 2019-05-03 DIAGNOSIS — I6389 Other cerebral infarction: Secondary | ICD-10-CM

## 2019-05-03 LAB — CBC WITH DIFFERENTIAL/PLATELET
Abs Immature Granulocytes: 0.02 10*3/uL (ref 0.00–0.07)
Basophils Absolute: 0.1 10*3/uL (ref 0.0–0.1)
Basophils Relative: 1 %
Eosinophils Absolute: 0.1 10*3/uL (ref 0.0–0.5)
Eosinophils Relative: 2 %
HCT: 45.6 % (ref 39.0–52.0)
Hemoglobin: 15.6 g/dL (ref 13.0–17.0)
Immature Granulocytes: 0 %
Lymphocytes Relative: 31 %
Lymphs Abs: 1.4 10*3/uL (ref 0.7–4.0)
MCH: 33.3 pg (ref 26.0–34.0)
MCHC: 34.2 g/dL (ref 30.0–36.0)
MCV: 97.4 fL (ref 80.0–100.0)
Monocytes Absolute: 0.6 10*3/uL (ref 0.1–1.0)
Monocytes Relative: 12 %
Neutro Abs: 2.5 10*3/uL (ref 1.7–7.7)
Neutrophils Relative %: 54 %
Platelets: 219 10*3/uL (ref 150–400)
RBC: 4.68 MIL/uL (ref 4.22–5.81)
RDW: 13.1 % (ref 11.5–15.5)
WBC: 4.7 10*3/uL (ref 4.0–10.5)
nRBC: 0 % (ref 0.0–0.2)

## 2019-05-03 LAB — COMPREHENSIVE METABOLIC PANEL
ALT: 28 U/L (ref 0–44)
AST: 30 U/L (ref 15–41)
Albumin: 3.8 g/dL (ref 3.5–5.0)
Alkaline Phosphatase: 62 U/L (ref 38–126)
Anion gap: 9 (ref 5–15)
BUN: 9 mg/dL (ref 6–20)
CO2: 31 mmol/L (ref 22–32)
Calcium: 9.3 mg/dL (ref 8.9–10.3)
Chloride: 99 mmol/L (ref 98–111)
Creatinine, Ser: 0.79 mg/dL (ref 0.61–1.24)
GFR calc Af Amer: >60 mL/min (ref >60)
GFR calc non Af Amer: >60 mL/min (ref >60)
Glucose, Bld: 102 mg/dL — ABNORMAL HIGH (ref 70–99)
Potassium: 3.4 mmol/L — ABNORMAL LOW (ref 3.5–5.1)
Sodium: 139 mmol/L (ref 135–145)
Total Bilirubin: 1.3 mg/dL — ABNORMAL HIGH (ref 0.3–1.2)
Total Protein: 6.3 g/dL — ABNORMAL LOW (ref 6.5–8.1)

## 2019-05-03 LAB — ECHOCARDIOGRAM COMPLETE: Weight: 3200 oz

## 2019-05-03 LAB — MAGNESIUM: Magnesium: 2.1 mg/dL (ref 1.7–2.4)

## 2019-05-03 MED ORDER — THIAMINE HCL 100 MG PO TABS: 100.0000 mg | ORAL_TABLET | Freq: Every day | ORAL | 0 refills | Status: AC

## 2019-05-03 MED ORDER — ADULT MULTIVITAMIN W/MINERALS CH: 1.0000 | ORAL_TABLET | Freq: Every day | ORAL | Status: AC

## 2019-05-03 MED ORDER — FOLIC ACID 1 MG PO TABS: 1.0000 mg | ORAL_TABLET | Freq: Every day | ORAL | 0 refills | Status: AC

## 2019-05-03 MED ORDER — POTASSIUM CHLORIDE CRYS ER 20 MEQ PO TBCR
40.0000 meq | EXTENDED_RELEASE_TABLET | Freq: Once | ORAL | Status: AC
  Administered 2019-05-03: 09:00:00 40 meq via ORAL
  Filled 2019-05-03: qty 2

## 2019-05-03 MED ORDER — ASPIRIN 81 MG PO TBEC: 81.0000 mg | DELAYED_RELEASE_TABLET | Freq: Every day | ORAL | 0 refills | Status: AC

## 2019-05-03 MED ORDER — ATORVASTATIN CALCIUM 20 MG PO TABS: 20.0000 mg | ORAL_TABLET | Freq: Every day | ORAL | 0 refills | Status: AC

## 2019-05-03 MED ORDER — CLOPIDOGREL BISULFATE 75 MG PO TABS: 75.0000 mg | ORAL_TABLET | Freq: Every day | ORAL | 0 refills | Status: AC

## 2019-05-03 NOTE — Discharge Summary (Signed)
Physician Discharge Summary  Walter Harper T Harper [name] DOB: 08/30/1976 DOA: 05/01/2019  PCP: Patient, No Pcp Per  Admit date: 05/01/2019 Discharge date: 05/03/2019  Admitted From: Home Disposition: Home  Recommendations for Outpatient Follow-up:  1. Follow up with PCP in 1 week with repeat CBC/BMP 2. Outpatient follow-up with neurology 3. Abstain from alcohol and illicit drug use 4. Follow up in ED if symptoms worsen or new appear   Home Health: No Equipment/Devices: None  Discharge Condition: Stable CODE STATUS: Full Diet recommendation: Heart healthy  Brief/Interim Summary: 43 year old male with history of alcohol abuse presented on 05/01/2019 with left arm numbness.  CT of the head was negative for acute intracranial abnormality.  Urine drug screen was positive for cocaine.  Neurology was consulted.  MRI of the brain was negative for acute stroke.  Neurology recommended aspirin and Plavix for 3 weeks for TIA and aspirin alone subsequently.  Patient's neurological symptoms have improved.  He will be discharged home.  Discharge Diagnoses:   Probable TIA causing left arm numbness  -Initial CT of the head was negative for acute intracranial abnormality.  MRI of the brain was negative for CVA.  CTA head and neck was normal.  -Symptoms have improved. -Neurology recommends aspirin 81 mg daily and Plavix 75 mg daily for 3 weeks and then aspirin alone.  Lipitor 20 mg at bedtime has been started.   -LDL is 94.  Hemoglobin A1c 5.1.   -2D echo report is pending.  This can be followed up as an outpatient.  History of alcohol abuse -Alcohol level was less than 10 on admission.    Treated with CIWA protocol.  Continue multivitamin, thiamine, folic acid.    Abstinent from alcohol. -Currently stable.  Cocaine abuse -Counseled regarding cessation.    Patient denies cocaine use.  Hypokalemia -Replace prior to discharge. Discharge Instructions  Discharge Instructions     Ambulatory referral to Neurology   Complete by: As directed    An appointment is requested in approximately: Few weeks: TIA   Diet - low sodium heart healthy   Complete by: As directed    Increase activity slowly   Complete by: As directed      Allergies as of 05/03/2019   No Known Allergies     Medication List    TAKE these medications   aspirin 81 MG EC tablet Take 1 tablet (81 mg total) by mouth daily. Start taking on: May 04, 2019   atorvastatin 20 MG tablet Commonly known as: LIPITOR Take 1 tablet (20 mg total) by mouth daily at 6 PM.   cetirizine 10 MG tablet Commonly known as: ZYRTEC Take 10 mg by mouth daily as needed for allergies.   clopidogrel 75 MG tablet Commonly known as: PLAVIX Take 1 tablet (75 mg total) by mouth daily. Start taking on: May 04, 2019   folic acid 1 MG tablet Commonly known as: FOLVITE Take 1 tablet (1 mg total) by mouth daily. Start taking on: May 04, 2019   multivitamin with minerals Tabs tablet Take 1 tablet by mouth daily. Start taking on: May 04, 2019   thiamine 100 MG tablet Take 1 tablet (100 mg total) by mouth daily. Start taking on: May 04, 2019      Follow-up Information    [facility]. Schedule an appointment as soon as possible for a visit in 4 week(s).   Specialty: Neurology Contact information: [address] [place] [phone]  PCP. Schedule an appointment as soon as possible for a visit in 1 week(s).        [facility] Call.   Why: Call this number or the number on your insurance card to make an appointment with a local priary care doctor who takes your insurance. Contact information: [phone]         No Known Allergies  Consultations:  Neurology   Procedures/Studies: Ct Angio Head W Or Wo Contrast  Result Date: 05/02/2019 CLINICAL DATA:  Stroke EXAM: CT ANGIOGRAPHY HEAD AND NECK TECHNIQUE: Multidetector CT  imaging of the head and neck was performed using the standard protocol during bolus administration of intravenous contrast. Multiplanar CT image reconstructions and MIPs were obtained to evaluate the vascular anatomy. Carotid stenosis measurements (when applicable) are obtained utilizing NASCET criteria, using the distal internal carotid diameter as the denominator. CONTRAST:  75mL OMNIPAQUE IOHEXOL 350 MG/ML SOLN COMPARISON:  CT head 05/01/2019 FINDINGS: CTA NECK FINDINGS Aortic arch: Bovine branching pattern. Normal aortic arch. Proximal great vessels normal. Right carotid system: Normal right carotid. Negative for stenosis atherosclerotic disease or dissection Left carotid system: Normal left carotid. Negative for stenosis atherosclerotic disease or dissection. Vertebral arteries: Normal vertebral bilaterally. Skeleton: Negative Other neck: Negative Upper chest: Negative Review of the MIP images confirms the above findings CTA HEAD FINDINGS Anterior circulation: Cavernous carotid widely patent bilaterally without stenosis or aneurysm. Anterior and middle cerebral arteries normal bilaterally. Posterior circulation: Both vertebral arteries patent to the basilar. Basilar widely patent. AICA, superior cerebellar, posterior cerebral arteries patent bilaterally without stenosis. Venous sinuses: Normal venous enhancement. Anatomic variants: None Review of the MIP images confirms the above findings IMPRESSION: Normal CTA head neck. No stenosis aneurysm or dissection identified. Normal intracranial circulation. Electronically Signed   By: Marlan Palau M.D.   On: 05/02/2019 11:54   Ct Angio Neck W Or Wo Contrast  Result Date: 05/02/2019 CLINICAL DATA:  Stroke EXAM: CT ANGIOGRAPHY HEAD AND NECK TECHNIQUE: Multidetector CT imaging of the head and neck was performed using the standard protocol during bolus administration of intravenous contrast. Multiplanar CT image reconstructions and MIPs were obtained to evaluate the  vascular anatomy. Carotid stenosis measurements (when applicable) are obtained utilizing NASCET criteria, using the distal internal carotid diameter as the denominator. CONTRAST:  75mL OMNIPAQUE IOHEXOL 350 MG/ML SOLN COMPARISON:  CT head 05/01/2019 FINDINGS: CTA NECK FINDINGS Aortic arch: Bovine branching pattern. Normal aortic arch. Proximal great vessels normal. Right carotid system: Normal right carotid. Negative for stenosis atherosclerotic disease or dissection Left carotid system: Normal left carotid. Negative for stenosis atherosclerotic disease or dissection. Vertebral arteries: Normal vertebral bilaterally. Skeleton: Negative Other neck: Negative Upper chest: Negative Review of the MIP images confirms the above findings CTA HEAD FINDINGS Anterior circulation: Cavernous carotid widely patent bilaterally without stenosis or aneurysm. Anterior and middle cerebral arteries normal bilaterally. Posterior circulation: Both vertebral arteries patent to the basilar. Basilar widely patent. AICA, superior cerebellar, posterior cerebral arteries patent bilaterally without stenosis. Venous sinuses: Normal venous enhancement. Anatomic variants: None Review of the MIP images confirms the above findings IMPRESSION: Normal CTA head neck. No stenosis aneurysm or dissection identified. Normal intracranial circulation. Electronically Signed   By: Marlan Palau M.D.   On: 05/02/2019 11:54   Mr Brain Wo Contrast  Result Date: 05/02/2019 CLINICAL DATA:  Left arm numbness beginning yesterday. Negative CT angiography. EXAM: MRI HEAD WITHOUT CONTRAST TECHNIQUE: Multiplanar, multiecho pulse sequences of the brain and surrounding structures were obtained without intravenous contrast. COMPARISON:  CT studies yesterday  and today. FINDINGS: Brain: The brain has a normal appearance without evidence of malformation, atrophy, old or acute small or large vessel infarction, mass lesion, hemorrhage, hydrocephalus or extra-axial  collection. Vascular: Major vessels at the base of the brain show flow. Venous sinuses appear patent. Skull and upper cervical spine: Normal. Sinuses/Orbits: Clear/normal. Other: None significant. IMPRESSION: Normal examination. No abnormality seen to explain the clinical presentation. Electronically Signed   By: Nelson Chimes M.D.   On: 05/02/2019 16:08   Ct Head Code Stroke Wo Contrast  Result Date: 05/01/2019 CLINICAL DATA:  Code stroke. Left sided arm numbness beginning 1800 hours EXAM: CT HEAD WITHOUT CONTRAST TECHNIQUE: Contiguous axial images were obtained from the base of the skull through the vertex without intravenous contrast. COMPARISON:  None. FINDINGS: Brain: Normal appearance without evidence of atrophy, old or acute infarction, mass lesion, hemorrhage, hydrocephalus or extra-axial collection. Vascular: No abnormal vascular finding. Skull: Negative Sinuses/Orbits: Clear/normal Other: Sebaceous cysts of the scalp. ASPECTS [facility] Stroke Program Early CT Score) - Ganglionic level infarction (caudate, lentiform nuclei, internal capsule, insula, M1-M3 cortex): 7 - Supraganglionic infarction (M4-M6 cortex): 3 Total score (0-10 with 10 being normal): 10 IMPRESSION: 1. No acute CT finding.  Normal appearance of the brain. 2. ASPECTS is 10. 3. These results were called by telephone at the time of interpretation on 05/01/2019 at 7:16 pm to Dr. Virgel Manifold , who verbally acknowledged these results. Electronically Signed   By: Nelson Chimes M.D.   On: 05/01/2019 19:18    Echo report pending   Subjective: Patient seen and examined at bedside.  He feels better and wants to go home.  Feels that his numbness is improved.  No overnight fever or vomiting.  Discharge Exam: Vitals:   05/03/19 0400 05/03/19 0833  BP: 128/85 (!) 142/80  Pulse: 68 68  Resp: 16 17  Temp: 98.2 F (36.8 C) 98 F (36.7 C)  SpO2: 100% 100%    General: Pt is alert, awake, not in acute distress Cardiovascular: rate  controlled, S1/S2 + Respiratory: bilateral decreased breath sounds at bases Abdominal: Soft, NT, ND, bowel sounds + Extremities: no edema, no cyanosis    The results of significant diagnostics from this hospitalization (including imaging, microbiology, ancillary and laboratory) are listed below for reference.     Microbiology: Recent Results (from the past 240 hour(s))  SARS CORONAVIRUS 2 (TAT 6-12 HRS) Nasal Swab Aptima Multi Swab     Status: None   Collection Time: 05/02/19 12:24 AM   Specimen: Aptima Multi Swab; Nasal Swab  Result Value Ref Range Status   SARS Coronavirus 2 NEGATIVE NEGATIVE Final    Comment: (NOTE) SARS-CoV-2 target nucleic acids are NOT DETECTED. The SARS-CoV-2 RNA is generally detectable in upper and lower respiratory specimens during the acute phase of infection. Negative results do not preclude SARS-CoV-2 infection, do not rule out co-infections with other pathogens, and should not be used as the sole basis for treatment or other patient management decisions. Negative results must be combined with clinical observations, patient history, and epidemiological information. The expected result is Negative. Fact Sheet for Patients: SugarRoll.be Fact Sheet for Healthcare Providers: https://www.woods-mathews.com/ This test is not yet approved or cleared by the [place] FDA and  has been authorized for detection and/or diagnosis of SARS-CoV-2 by FDA under an Emergency Use Authorization (EUA). This EUA will remain  in effect (meaning this test can be used) for the duration of the COVID-19 declaration under Section 56 4(b)(1) of the Act, 21 U.S.C.  section 360bbb-3(b)(1), unless the authorization is terminated or revoked sooner. Performed at [facility], [address] [place], [place] [address]      Labs: BNP (last 3 results) No results for input(s): BNP in the last 8760 hours. Basic Metabolic  Panel: Recent Labs  Lab 05/01/19 1904 05/01/19 1911 05/02/19 0825 05/03/19 0408  NA 140 139 138 139  K 2.8* 2.9* 2.7* 3.4*  CL 94* 95* 96* 99  CO2 28  --  30 31  GLUCOSE 123* 122* 96 102*  BUN 8 6 5* 9  CREATININE 0.82 0.70 0.73 0.79  CALCIUM 9.7  --  9.3 9.3  MG  --   --  2.0 2.1   Liver Function Tests: Recent Labs  Lab 05/01/19 1904 05/02/19 0825 05/03/19 0408  AST 58* 40 30  ALT 39 31 28  ALKPHOS 71 59 62  BILITOT 1.1 1.6* 1.3*  PROT 8.4* 6.8 6.3*  ALBUMIN 5.0 3.9 3.8   No results for input(s): LIPASE, AMYLASE in the last 168 hours. No results for input(s): AMMONIA in the last 168 hours. CBC: Recent Labs  Lab 05/01/19 1904 05/01/19 1911 05/02/19 0825 05/03/19 0408  WBC 9.8  --  5.5 4.7  NEUTROABS 8.1*  --  3.4 2.5  HGB 16.3 16.7 15.2 15.6  HCT 46.5 49.0 42.9 45.6  MCV 95.1  --  95.3 97.4  PLT 268  --  230 219   Cardiac Enzymes: No results for input(s): CKTOTAL, CKMB, CKMBINDEX, TROPONINI in the last 168 hours. BNP: Invalid input(s): POCBNP CBG: No results for input(s): GLUCAP in the last 168 hours. D-Dimer No results for input(s): DDIMER in the last 72 hours. Hgb A1c Recent Labs    05/02/19 0825  HGBA1C 5.1   Lipid Profile Recent Labs    05/02/19 0825  CHOL 194  HDL 87  LDLCALC 94  TRIG 67  CHOLHDL 2.2   Thyroid function studies No results for input(s): TSH, T4TOTAL, T3FREE, THYROIDAB in the last 72 hours.  Invalid input(s): FREET3 Anemia work up No results for input(s): VITAMINB12, FOLATE, FERRITIN, TIBC, IRON, RETICCTPCT in the last 72 hours. Urinalysis    Component Value Date/Time   COLORURINE AMBER (A) 05/01/2019 1904   APPEARANCEUR CLEAR 05/01/2019 1904   LABSPEC 1.024 05/01/2019 1904   PHURINE 5.0 05/01/2019 1904   GLUCOSEU 50 (A) 05/01/2019 1904   HGBUR NEGATIVE 05/01/2019 1904   BILIRUBINUR NEGATIVE 05/01/2019 1904   KETONESUR 20 (A) 05/01/2019 1904   PROTEINUR 30 (A) 05/01/2019 1904   NITRITE NEGATIVE 05/01/2019 1904    LEUKOCYTESUR TRACE (A) 05/01/2019 1904   Sepsis Labs Invalid input(s): PROCALCITONIN,  WBC,  LACTICIDVEN Microbiology Recent Results (from the past 240 hour(s))  SARS CORONAVIRUS 2 (TAT 6-12 HRS) Nasal Swab Aptima Multi Swab     Status: None   Collection Time: 05/02/19 12:24 AM   Specimen: Aptima Multi Swab; Nasal Swab  Result Value Ref Range Status   SARS Coronavirus 2 NEGATIVE NEGATIVE Final    Comment: (NOTE) SARS-CoV-2 target nucleic acids are NOT DETECTED. The SARS-CoV-2 RNA is generally detectable in upper and lower respiratory specimens during the acute phase of infection. Negative results do not preclude SARS-CoV-2 infection, do not rule out co-infections with other pathogens, and should not be used as the sole basis for treatment or other patient management decisions. Negative results must be combined with clinical observations, patient history, and epidemiological information. The expected result is Negative. Fact Sheet for Patients: HairSlick.no Fact Sheet for Healthcare  Providers: quierodirigir.comhttps://www.fda.gov/media/138095/download This test is not yet approved or cleared by the [place] FDA and  has been authorized for detection and/or diagnosis of SARS-CoV-2 by FDA under an Emergency Use Authorization (EUA). This EUA will remain  in effect (meaning this test can be used) for the duration of the COVID-19 declaration under Section 56 4(b)(1) of the Act, 21 U.S.C. section 360bbb-3(b)(1), unless the authorization is terminated or revoked sooner. Performed at [facility], [address] [place], [place] [address]      Time coordinating discharge: 35 minutes  SIGNED:   Glade LloydKshitiz Katura Eatherly, MD  [facility] 05/03/2019, 11:01 AM

## 2019-05-03 NOTE — Progress Notes (Signed)
Echocardiogram 2D Echocardiogram has been performed.  Oneal Deputy Karter Haire 05/03/2019, 8:24 AM

## 2019-05-03 NOTE — Care Management (Signed)
Included [facility] contact information to AVS for patient to find PCP.

## 2019-05-03 NOTE — Progress Notes (Signed)
Nsg Discharge Note  Admit Date:  05/01/2019 Discharge date: 05/03/2019   Walter Harper to be D/C'd Home per MD order.  AVS completed.  Copy for chart, and copy for patient signed, and dated. Patient/caregiver able to verbalize understanding.  Discharge Medication: Allergies as of 05/03/2019   No Known Allergies     Medication List    TAKE these medications   aspirin 81 MG EC tablet Take 1 tablet (81 mg total) by mouth daily. Start taking on: May 04, 2019   atorvastatin 20 MG tablet Commonly known as: LIPITOR Take 1 tablet (20 mg total) by mouth daily at 6 PM.   cetirizine 10 MG tablet Commonly known as: ZYRTEC Take 10 mg by mouth daily as needed for allergies.   clopidogrel 75 MG tablet Commonly known as: PLAVIX Take 1 tablet (75 mg total) by mouth daily. Start taking on: May 03, 4480   folic acid 1 MG tablet Commonly known as: FOLVITE Take 1 tablet (1 mg total) by mouth daily. Start taking on: May 04, 2019   multivitamin with minerals Tabs tablet Take 1 tablet by mouth daily. Start taking on: May 04, 2019   thiamine 100 MG tablet Take 1 tablet (100 mg total) by mouth daily. Start taking on: May 04, 2019       Discharge Assessment: Vitals:   05/03/19 0400 05/03/19 0833  BP: 128/85 (!) 142/80  Pulse: 68 68  Resp: 16 17  Temp: 98.2 F (36.8 C) 98 F (36.7 C)  SpO2: 100% 100%   Skin clean, dry and intact without evidence of skin break down, no evidence of skin tears noted. IV catheter discontinued intact. Site without signs and symptoms of complications - no redness or edema noted at insertion site, patient denies c/o pain - only slight tenderness at site.  Dressing with slight pressure applied.  D/c Instructions-Education: Discharge instructions given to patient/family with verbalized understanding. D/c education completed with patient/family including follow up instructions, medication list, d/c activities limitations if indicated,  with other d/c instructions as indicated by MD - patient able to verbalize understanding, all questions fully answered. Patient instructed to return to ED, call 911, or call MD for any changes in condition.  Patient escorted via [place], and D/C home via private auto.  Tresa Endo, RN 05/03/2019 11:05 AM

## 2019-05-06 ENCOUNTER — Observation Stay (HOSPITAL_COMMUNITY)
Admission: EM | Admit: 2019-05-06 | Discharge: 2019-05-07 | Disposition: A | Discharge status: Home / Self Care | Payer: BLUE CROSS/BLUE SHIELD | Attending: Internal Medicine | Admitting: Internal Medicine

## 2019-05-06 ENCOUNTER — Encounter (HOSPITAL_COMMUNITY): Payer: Self-pay | Admitting: Emergency Medicine

## 2019-05-06 ENCOUNTER — Other Ambulatory Visit: Payer: Self-pay

## 2019-05-06 DIAGNOSIS — E785 Hyperlipidemia, unspecified: Secondary | ICD-10-CM | POA: Diagnosis not present

## 2019-05-06 DIAGNOSIS — F1410 Cocaine abuse, uncomplicated: Secondary | ICD-10-CM

## 2019-05-06 DIAGNOSIS — Z20828 Contact with and (suspected) exposure to other viral communicable diseases: Secondary | ICD-10-CM | POA: Diagnosis not present

## 2019-05-06 DIAGNOSIS — Z8673 Personal history of transient ischemic attack (TIA), and cerebral infarction without residual deficits: Secondary | ICD-10-CM | POA: Diagnosis not present

## 2019-05-06 DIAGNOSIS — E876 Hypokalemia: Secondary | ICD-10-CM | POA: Diagnosis present

## 2019-05-06 DIAGNOSIS — F10231 Alcohol dependence with withdrawal delirium: Secondary | ICD-10-CM | POA: Diagnosis present

## 2019-05-06 DIAGNOSIS — F10931 Alcohol use, unspecified with withdrawal delirium: Secondary | ICD-10-CM

## 2019-05-06 DIAGNOSIS — Z79899 Other long term (current) drug therapy: Secondary | ICD-10-CM | POA: Insufficient documentation

## 2019-05-06 DIAGNOSIS — F10230 Alcohol dependence with withdrawal, uncomplicated: Secondary | ICD-10-CM | POA: Diagnosis not present

## 2019-05-06 DIAGNOSIS — R443 Hallucinations, unspecified: Secondary | ICD-10-CM

## 2019-05-06 DIAGNOSIS — Z7982 Long term (current) use of aspirin: Secondary | ICD-10-CM | POA: Insufficient documentation

## 2019-05-06 HISTORY — DX: Alcohol abuse, uncomplicated: F10.10

## 2019-05-06 HISTORY — DX: Transient cerebral ischemic attack, unspecified: G45.9

## 2019-05-06 HISTORY — DX: Hyperlipidemia, unspecified: E78.5

## 2019-05-06 LAB — CBC
HCT: 42.7 % (ref 39.0–52.0)
Hemoglobin: 15.2 g/dL (ref 13.0–17.0)
MCH: 34.2 pg — ABNORMAL HIGH (ref 26.0–34.0)
MCHC: 35.6 g/dL (ref 30.0–36.0)
MCV: 96.2 fL (ref 80.0–100.0)
Platelets: 257 10*3/uL (ref 150–400)
RBC: 4.44 MIL/uL (ref 4.22–5.81)
RDW: 12.8 % (ref 11.5–15.5)
WBC: 7.1 10*3/uL (ref 4.0–10.5)
nRBC: 0 % (ref 0.0–0.2)

## 2019-05-06 LAB — RAPID URINE DRUG SCREEN, HOSP PERFORMED
Amphetamines: NOT DETECTED
Barbiturates: NOT DETECTED
Benzodiazepines: NOT DETECTED
Cocaine: POSITIVE — AB
Opiates: NOT DETECTED
Tetrahydrocannabinol: NOT DETECTED

## 2019-05-06 LAB — COMPREHENSIVE METABOLIC PANEL
ALT: 32 U/L (ref 0–44)
AST: 32 U/L (ref 15–41)
Albumin: 4.3 g/dL (ref 3.5–5.0)
Alkaline Phosphatase: 57 U/L (ref 38–126)
Anion gap: 12 (ref 5–15)
BUN: 13 mg/dL (ref 6–20)
CO2: 24 mmol/L (ref 22–32)
Calcium: 9.2 mg/dL (ref 8.9–10.3)
Chloride: 102 mmol/L (ref 98–111)
Creatinine, Ser: 0.8 mg/dL (ref 0.61–1.24)
GFR calc Af Amer: >60 mL/min (ref >60)
GFR calc non Af Amer: >60 mL/min (ref >60)
Glucose, Bld: 131 mg/dL — ABNORMAL HIGH (ref 70–99)
Potassium: 2.9 mmol/L — ABNORMAL LOW (ref 3.5–5.1)
Sodium: 138 mmol/L (ref 135–145)
Total Bilirubin: 0.6 mg/dL (ref 0.3–1.2)
Total Protein: 7.2 g/dL (ref 6.5–8.1)

## 2019-05-06 LAB — ETHANOL: Alcohol, Ethyl (B): <10 mg/dL (ref <10)

## 2019-05-06 LAB — MAGNESIUM: Magnesium: 1.9 mg/dL (ref 1.7–2.4)

## 2019-05-06 MED ORDER — SODIUM CHLORIDE 0.9 % IV SOLN
INTRAVENOUS | Status: DC
  Administered 2019-05-06: 13:00:00 via INTRAVENOUS

## 2019-05-06 MED ORDER — LORATADINE 10 MG PO TABS: 10.0000 mg | ORAL_TABLET | Freq: Every day | ORAL | Status: DC | PRN

## 2019-05-06 MED ORDER — ACETAMINOPHEN 325 MG PO TABS: 650.0000 mg | ORAL_TABLET | Freq: Four times a day (QID) | ORAL | Status: DC | PRN

## 2019-05-06 MED ORDER — LORAZEPAM 2 MG/ML IJ SOLN
0.0000 mg | Freq: Four times a day (QID) | INTRAMUSCULAR | Status: DC
  Administered 2019-05-06: 2 mg via INTRAVENOUS
  Filled 2019-05-06: qty 1

## 2019-05-06 MED ORDER — ACETAMINOPHEN 650 MG RE SUPP: 650.0000 mg | Freq: Four times a day (QID) | RECTAL | Status: DC | PRN

## 2019-05-06 MED ORDER — THIAMINE HCL 100 MG/ML IJ SOLN: 100.0000 mg | Freq: Every day | INTRAMUSCULAR | Status: DC

## 2019-05-06 MED ORDER — VITAMIN B-1 100 MG PO TABS: 100.0000 mg | ORAL_TABLET | Freq: Every day | ORAL | Status: DC

## 2019-05-06 MED ORDER — FOLIC ACID 1 MG PO TABS
1.0000 mg | ORAL_TABLET | Freq: Every day | ORAL | Status: DC
  Administered 2019-05-06 – 2019-05-07 (×2): 1 mg via ORAL
  Filled 2019-05-06 (×2): qty 1

## 2019-05-06 MED ORDER — LORAZEPAM 2 MG/ML IJ SOLN
2.0000 mg | Freq: Once | INTRAMUSCULAR | Status: AC
  Administered 2019-05-06: 05:00:00 2 mg via INTRAVENOUS
  Filled 2019-05-06: qty 1

## 2019-05-06 MED ORDER — LORAZEPAM 2 MG/ML IJ SOLN: 1.0000 mg | Freq: Once | INTRAMUSCULAR | Status: DC

## 2019-05-06 MED ORDER — ASPIRIN EC 81 MG PO TBEC
81.0000 mg | DELAYED_RELEASE_TABLET | Freq: Every day | ORAL | Status: DC
  Administered 2019-05-06 – 2019-05-07 (×2): 81 mg via ORAL
  Filled 2019-05-06 (×2): qty 1

## 2019-05-06 MED ORDER — POTASSIUM CHLORIDE CRYS ER 20 MEQ PO TBCR
40.0000 meq | EXTENDED_RELEASE_TABLET | Freq: Once | ORAL | Status: AC
  Administered 2019-05-06: 16:00:00 40 meq via ORAL
  Filled 2019-05-06: qty 2

## 2019-05-06 MED ORDER — CLOPIDOGREL BISULFATE 75 MG PO TABS
75.0000 mg | ORAL_TABLET | Freq: Every day | ORAL | Status: DC
  Administered 2019-05-07: 10:00:00 75 mg via ORAL
  Filled 2019-05-06 (×2): qty 1

## 2019-05-06 MED ORDER — LORAZEPAM 2 MG/ML IJ SOLN: 0.0000 mg | Freq: Two times a day (BID) | INTRAMUSCULAR | Status: DC

## 2019-05-06 MED ORDER — LORAZEPAM 1 MG PO TABS
0.0000 mg | ORAL_TABLET | Freq: Four times a day (QID) | ORAL | Status: DC
  Filled 2019-05-06: qty 2

## 2019-05-06 MED ORDER — ADULT MULTIVITAMIN W/MINERALS CH
1.0000 | ORAL_TABLET | Freq: Every day | ORAL | Status: DC
  Administered 2019-05-06 – 2019-05-07 (×2): 1 via ORAL
  Filled 2019-05-06 (×2): qty 1

## 2019-05-06 MED ORDER — ONDANSETRON HCL 4 MG PO TABS: 4.0000 mg | ORAL_TABLET | Freq: Four times a day (QID) | ORAL | Status: DC | PRN

## 2019-05-06 MED ORDER — SODIUM CHLORIDE 0.9 % IV BOLUS
1000.0000 mL | Freq: Once | INTRAVENOUS | Status: AC
  Administered 2019-05-06: 1000 mL via INTRAVENOUS

## 2019-05-06 MED ORDER — THIAMINE HCL 100 MG/ML IJ SOLN
500.0000 mg | INTRAVENOUS | Status: DC
  Administered 2019-05-06 – 2019-05-07 (×2): 500 mg via INTRAVENOUS
  Filled 2019-05-06 (×2): qty 5

## 2019-05-06 MED ORDER — ONDANSETRON HCL 4 MG/2ML IJ SOLN: 4.0000 mg | Freq: Four times a day (QID) | INTRAMUSCULAR | Status: DC | PRN

## 2019-05-06 MED ORDER — ALBUTEROL SULFATE (2.5 MG/3ML) 0.083% IN NEBU: 2.5000 mg | INHALATION_SOLUTION | Freq: Four times a day (QID) | RESPIRATORY_TRACT | Status: DC | PRN

## 2019-05-06 MED ORDER — LORAZEPAM 1 MG PO TABS: 0.0000 mg | ORAL_TABLET | Freq: Two times a day (BID) | ORAL | Status: DC

## 2019-05-06 MED ORDER — ATORVASTATIN CALCIUM 10 MG PO TABS: 20.0000 mg | ORAL_TABLET | Freq: Every day | ORAL | Status: DC

## 2019-05-06 MED ORDER — SODIUM CHLORIDE 0.9% FLUSH
3.0000 mL | Freq: Two times a day (BID) | INTRAVENOUS | Status: DC
  Administered 2019-05-06 – 2019-05-07 (×2): 3 mL via INTRAVENOUS

## 2019-05-06 MED ORDER — ENOXAPARIN SODIUM 40 MG/0.4ML ~~LOC~~ SOLN
40.0000 mg | SUBCUTANEOUS | Status: DC
  Filled 2019-05-06 (×2): qty 0.4

## 2019-05-06 NOTE — ED Notes (Signed)
Lunch tray ordered 

## 2019-05-06 NOTE — ED Provider Notes (Signed)
[facility] EMERGENCY DEPARTMENT Provider Note   CSN: [id number] Arrival date & time: 05/06/19  0430     History   Chief Complaint Chief Complaint  Patient presents with   Medical Clearance    HPI Walter Harper is a 43 y.o. male.     The history is provided by the patient and medical records.    43 y.o. M with history of cocaine abuse, recent TIA, history of heavy alcoholism, presenting to the ED for hallucinations.  Wife at bedside providing history.  Reports patient just left the hospital on 05/03/19 after admission for TIA.  States prior to this he was an extremely heavy drinking, approx 1/5 liquor daily.  He has not had any alcohol since just before his admission.  Reports over the past 24 hours he has been acting very paranoid, has been hallucinating about deer getting in his car and driving off, thinking there are people under his bed, asking random questions, has not eaten, and has not slept in 3 days.  He actually called 911 today because he thought someone kidnapped his daughter-- EMS showed up to the house and brought him here at wife's insistence.  Patient is adamant that his symptoms are due to the new medications that he was started on while in the hospital (plavix, ASA, lipitor, folic acid, thiamine).  He states his arm feels like there is something crawling on it.  Wife denies any known psychiatric history.  He has not had any substance abuse recently.  Here in the ED he is extremely paranoid, unable to sit still.  History reviewed. No pertinent past medical history.  Patient Active Problem List   Diagnosis Date Noted   Cocaine abuse (HCC) 05/02/2019   Acute lower UTI 05/02/2019   Acute cerebrovascular accident (CVA) (HCC) 05/01/2019   Alcohol dependence with uncomplicated withdrawal (HCC) 05/01/2019   Hypokalemia 05/01/2019   Hyperglycemia 05/01/2019    History reviewed. No pertinent surgical history.      Home Medications     Prior to Admission medications   Medication Sig Start Date End Date Taking? Authorizing Provider  aspirin EC 81 MG EC tablet Take 1 tablet (81 mg total) by mouth daily. 05/04/19   Glade Lloyd, MD  atorvastatin (LIPITOR) 20 MG tablet Take 1 tablet (20 mg total) by mouth daily at 6 PM. 05/03/19   Glade Lloyd, MD  cetirizine (ZYRTEC) 10 MG tablet Take 10 mg by mouth daily as needed for allergies.    [provider]  clopidogrel (PLAVIX) 75 MG tablet Take 1 tablet (75 mg total) by mouth daily. 05/04/19   Glade Lloyd, MD  folic acid (FOLVITE) 1 MG tablet Take 1 tablet (1 mg total) by mouth daily. 05/04/19   Glade Lloyd, MD  Multiple Vitamin (MULTIVITAMIN WITH MINERALS) TABS tablet Take 1 tablet by mouth daily. 05/04/19   Glade Lloyd, MD  thiamine 100 MG tablet Take 1 tablet (100 mg total) by mouth daily. 05/04/19   Glade Lloyd, MD    Family History No family history on file.  Social History Social History   Tobacco Use   Smoking status: Never Smoker   Smokeless tobacco: Never Used  Substance Use Topics   Alcohol use: Yes    Comment: fifth a day   Drug use: Not on file     Allergies   Patient has no known allergies.   Review of Systems Review of Systems  Psychiatric/Behavioral: Positive for hallucinations.  All other systems reviewed and  are negative.    Physical Exam Updated Vital Signs BP (!) 152/101    Pulse 98    SpO2 100%   Physical Exam Vitals signs and nursing note reviewed.  Constitutional:      Appearance: He is well-developed.     Comments: Diaphoretic, clothes are damp  HENT:     Head: Normocephalic and atraumatic.  Eyes:     Conjunctiva/sclera: Conjunctivae normal.     Pupils: Pupils are equal, round, and reactive to light.  Neck:     Musculoskeletal: Normal range of motion.  Cardiovascular:     Rate and Rhythm: Regular rhythm. Tachycardia present.     Heart sounds: Normal heart sounds.  Pulmonary:     Effort: Pulmonary  effort is normal.     Breath sounds: Normal breath sounds.  Abdominal:     General: Bowel sounds are normal.     Palpations: Abdomen is soft.  Musculoskeletal: Normal range of motion.  Skin:    General: Skin is warm and dry.     Comments: Some signs of excoriation on the arms noted  Neurological:     Mental Status: He is alert.     Comments: Awake, alert, does not seem to have insight into current situation, is asking for EMS to be called and is waiting for "his lawyer", tremors noted when arms out in front  Psychiatric:     Comments: Pacing in room, very paranoid, reports things crawling on his arm      ED Treatments / Results  Labs (all labs ordered are listed, but only abnormal results are displayed) Labs Reviewed  COMPREHENSIVE METABOLIC PANEL - Abnormal; Notable for the following components:      Result Value   Potassium 2.9 (*)    Glucose, Bld 131 (*)    All other components within normal limits  CBC - Abnormal; Notable for the following components:   MCH 34.2 (*)    All other components within normal limits  ETHANOL  RAPID URINE DRUG SCREEN, HOSP PERFORMED    EKG None  Radiology No results found.  Procedures Procedures (including critical care time)  CRITICAL CARE Performed by: Garlon HatchetLisa M Dietrich Samuelson   Total critical care time: 45 minutes  Critical care time was exclusive of separately billable procedures and treating other patients.  Critical care was necessary to treat or prevent imminent or life-threatening deterioration.  Critical care was time spent personally by me on the following activities: development of treatment plan with patient and/or surrogate as well as nursing, discussions with consultants, evaluation of patient's response to treatment, examination of patient, obtaining history from patient or surrogate, ordering and performing treatments and interventions, ordering and review of laboratory studies, ordering and review of radiographic studies,  pulse oximetry and re-evaluation of patient's condition.   Medications Ordered in ED Medications  LORazepam (ATIVAN) injection 0-4 mg (has no administration in time range)    Or  LORazepam (ATIVAN) tablet 0-4 mg (has no administration in time range)  LORazepam (ATIVAN) injection 0-4 mg (has no administration in time range)    Or  LORazepam (ATIVAN) tablet 0-4 mg (has no administration in time range)  thiamine (VITAMIN B-1) tablet 100 mg (has no administration in time range)    Or  thiamine (B-1) injection 100 mg (has no administration in time range)  potassium chloride SA (K-DUR) CR tablet 40 mEq (has no administration in time range)  LORazepam (ATIVAN) injection 2 mg (2 mg Intravenous Given 05/06/19 0526)  sodium chloride 0.9 %  bolus 1,000 mL (1,000 mLs Intravenous New Bag/Given 05/06/19 0527)     Initial Impression / Assessment and Plan / ED Course  I have reviewed the triage vital signs and the nursing notes.  Pertinent labs & imaging results that were available during my care of the patient were reviewed by me and considered in my medical decision making (see chart for details).  43 year old male presenting to the ED with new hallucinations.  He had a recent admission for TIA, discharged on 05/03/2019.  He has not had any alcohol since prior to his admission.  Wife reports he is a heavy daily drinker, approximately 1/5 of liquor a day.  Over the past 24 hours he has had hallucinations of deer driving the car, people under his bed, and similar.  States he has not ate or slept in about 3 days.  His behavior started scaring their children and other family members.  Patient actually called 911 today because he thought someone kidnapped her daughter, wife explained that this was a misunderstanding but had EMS bring him in for evaluation.  On arrival, patient is awake and alert, however he is very paranoid, pacing around room.  He is tachycardic and diaphoretic.  Suspect this is related to alcohol  withdrawal.  Labs pending.  He was given IVF and 2mg  ativan.  Of note, patient has no known psychiatric history.  I reviewed medications initiated from prior hospitalization, none of these should be causing hallucinations.  5:55 AM Patient more calm after ativan.  He is sitting in the bed now, wife holding his hand.  He has allowed [place] to hook him up to cardiac monitor and vitals are stable-- mildly tachy but improving.  I have explained my concerns to wife, she was concerned for same.  We are awaiting labs but will plan for admission.  Labs overall reassuring, mild hypokalemia at 2.9.  His ethanol is negative.  Awaiting drug screen.  Patient continues responding well to treatment with Ativan as per CIWA protocol.  Ordered 40mg  K+.  HR down into the 90's, initially around 120.  He is tolerating oral fluids.  Given his improved clinical state, do not feel he needs precex or ICU at this time.  Discussed with Dr. Alcario Drought-- plan to admit to SDU.  Final Clinical Impressions(s) / ED Diagnoses   Final diagnoses:  Alcohol withdrawal delirium [facility])  Hypokalemia    ED Discharge Orders    None       Larene Pickett, PA-C 05/06/19 [place], Delice Bison, DO 05/06/19 [phone]

## 2019-05-06 NOTE — ED Notes (Signed)
Pt appears to be very paranoid. Pt requesting this tech to stay in the room with him. Pt constantly standing up and walking around. Wife at bedside.

## 2019-05-06 NOTE — ED Triage Notes (Signed)
Patient recently diagnosed with stroke, patient is on new medications and having visual hallucinations.  Patient is diaphoretic in triage.

## 2019-05-06 NOTE — ED Notes (Signed)
Pt confused at times and SO will re-direct.  No noted agitation, appears sleepy at this time.

## 2019-05-06 NOTE — ED Notes (Addendum)
ED TO INPATIENT HANDOFF REPORT  ED Nurse Name and Phone #: Celene Squibb RN  S Name/Age/Gender Walter Harper 43 y.o. male Room/Bed: 056C/056C  Code Status   Code Status: Full Code  Home/SNF/Other Home Patient oriented to: self, place, time and situation Is this baseline? Yes   Pt has confusion at times and continues to hallucinate about driving his car, etc.   Triage Complete: Triage complete  Chief Complaint allergic reaction  Triage Note Patient recently diagnosed with stroke, patient is on new medications and having visual hallucinations.  Patient is diaphoretic in triage.     Allergies No Known Allergies  Level of Care/Admitting Diagnosis ED Disposition    ED Disposition Condition [facility] Area: [place] [[id number]]  Level of Care: Telemetry Medical [104]  I expect the patient will be discharged within 24 hours: No (not a candidate for 5C-Observation unit)  Covid Evaluation: Asymptomatic Screening Protocol (No Symptoms)  Diagnosis: Alcohol withdrawal ([place]) [291.81.ICD-9-CM]  Admitting Physician: Norval Morton [[id number]]  Attending Physician: Norval Morton [[id number]]  PT Class (Do Not Modify): Observation [104]  PT Acc Code (Do Not Modify): Observation [10022]       B Medical/Surgery History Past Medical History:  Diagnosis Date  . Alcohol abuse   . Hyperlipidemia   . TIA (transient ischemic attack)    History reviewed. No pertinent surgical history.   A IV Location/Drains/Wounds Patient Lines/Drains/Airways Status   Active Line/Drains/Airways    Name:   Placement date:   Placement time:   Site:   Days:   Peripheral IV 05/06/19 Right Forearm   05/06/19    0525    Forearm   less than 1          Intake/Output Last 24 hours  Intake/Output Summary (Last 24 hours) at 05/06/2019 1313 Last data filed at 05/06/2019 0700 Gross per 24 hour  Intake 1000 ml  Output -  Net 1000 ml    Labs/Imaging Results for  orders placed or performed during the hospital encounter of 05/06/19 (from the past 48 hour(s))  Comprehensive metabolic panel     Status: Abnormal   Collection Time: 05/06/19  5:24 AM  Result Value Ref Range   Sodium 138 135 - 145 mmol/L   Potassium 2.9 (L) 3.5 - 5.1 mmol/L   Chloride 102 98 - 111 mmol/L   CO2 24 22 - 32 mmol/L   Glucose, Bld 131 (H) 70 - 99 mg/dL   BUN 13 6 - 20 mg/dL   Creatinine, Ser 0.80 0.61 - 1.24 mg/dL   Calcium 9.2 8.9 - 10.3 mg/dL   Total Protein 7.2 6.5 - 8.1 g/dL   Albumin 4.3 3.5 - 5.0 g/dL   AST 32 15 - 41 U/L   ALT 32 0 - 44 U/L   Alkaline Phosphatase 57 38 - 126 U/L   Total Bilirubin 0.6 0.3 - 1.2 mg/dL   GFR calc non Af Amer >60 >60 mL/min   GFR calc Af Amer >60 >60 mL/min   Anion gap 12 5 - 15    Comment: Performed at [facility], [address], [place], [place] [address]  Ethanol     Status: None   Collection Time: 05/06/19  5:24 AM  Result Value Ref Range   Alcohol, Ethyl (B) <10 <10 mg/dL    Comment: (NOTE) Lowest detectable limit for serum alcohol is 10 mg/dL. For medical purposes only. Performed at [facility], [address] [place], [place] [address]   cbc     Status: Abnormal   Collection Time: 05/06/19  5:24 AM  Result Value Ref Range   WBC 7.1 4.0 - 10.5 K/uL   RBC 4.44 4.22 - 5.81 MIL/uL   Hemoglobin 15.2 13.0 - 17.0 g/dL   HCT 35.5 73.2 - 20.2 %   MCV 96.2 80.0 - 100.0 fL   MCH 34.2 (H) 26.0 - 34.0 pg   MCHC 35.6 30.0 - 36.0 g/dL   RDW 54.2 70.6 - 23.7 %   Platelets 257 150 - 400 K/uL   nRBC 0.0 0.0 - 0.2 %    Comment: Performed at [facility], [address] [place], [place] [address]  Magnesium     Status: None   Collection Time: 05/06/19  5:24 AM  Result Value Ref Range   Magnesium 1.9 1.7 - 2.4 mg/dL    Comment: Performed at [facility], [address], [place], [place] [address]  Rapid urine drug screen (hospital performed)     Status: Abnormal   Collection Time: 05/06/19  9:00 AM   Result Value Ref Range   Opiates NONE DETECTED NONE DETECTED   Cocaine POSITIVE (A) NONE DETECTED   Benzodiazepines NONE DETECTED NONE DETECTED   Amphetamines NONE DETECTED NONE DETECTED   Tetrahydrocannabinol NONE DETECTED NONE DETECTED   Barbiturates NONE DETECTED NONE DETECTED    Comment: (NOTE) DRUG SCREEN FOR MEDICAL PURPOSES ONLY.  IF CONFIRMATION IS NEEDED FOR ANY PURPOSE, NOTIFY LAB WITHIN 5 DAYS. LOWEST DETECTABLE LIMITS FOR URINE DRUG SCREEN Drug Class                     Cutoff (ng/mL) Amphetamine and metabolites    1000 Barbiturate and metabolites    200 Benzodiazepine                 200 Tricyclics and metabolites     300 Opiates and metabolites        300 Cocaine and metabolites        300 THC                            50 Performed at [facility], [address] [place], [place] [address]    No results found.  Pending Labs Unresulted Labs (From admission, onward)    Start     Ordered   05/07/19 0500  CBC  Tomorrow morning,   R     05/06/19 0752   05/07/19 0500  Basic metabolic panel  Tomorrow morning,   R     05/06/19 0752          Vitals/Pain Today's Vitals   05/06/19 1200 05/06/19 1215 05/06/19 1230 05/06/19 1245  BP: 133/88 128/86 114/72 106/73  Pulse: 74 71 81 83  Resp: 13 14 15 14   SpO2: 98% 96% 96% 95%  PainSc:        Isolation Precautions No active isolations  Medications Medications  LORazepam (ATIVAN) injection 0-4 mg (2 mg Intravenous Given 05/06/19 0703)    Or  LORazepam (ATIVAN) tablet 0-4 mg ( Oral See Alternative 05/06/19 0703)  LORazepam (ATIVAN) injection 0-4 mg (has no administration in time range)    Or  LORazepam (ATIVAN) tablet 0-4 mg (has no administration in time range)  potassium chloride SA (K-DUR) CR tablet 40 mEq (0 mEq Oral Hold 05/06/19 0843)  aspirin EC tablet 81  mg (has no administration in time range)  atorvastatin (LIPITOR) tablet 20 mg (has no administration in time range)  clopidogrel (PLAVIX) tablet  75 mg (has no administration in time range)  multivitamin with minerals tablet 1 tablet (has no administration in time range)  folic acid (FOLVITE) tablet 1 mg (has no administration in time range)  loratadine (CLARITIN) tablet 10 mg (has no administration in time range)  enoxaparin (LOVENOX) injection 40 mg (0 mg Subcutaneous Hold 05/06/19 0843)  sodium chloride flush (NS) 0.9 % injection 3 mL (has no administration in time range)  ondansetron (ZOFRAN) tablet 4 mg (has no administration in time range)    Or  ondansetron (ZOFRAN) injection 4 mg (has no administration in time range)  acetaminophen (TYLENOL) tablet 650 mg (has no administration in time range)    Or  acetaminophen (TYLENOL) suppository 650 mg (has no administration in time range)  albuterol (PROVENTIL) (2.5 MG/3ML) 0.083% nebulizer solution 2.5 mg (has no administration in time range)  LORazepam (ATIVAN) injection 1 mg (has no administration in time range)  0.9 %  sodium chloride infusion ( Intravenous New Bag/Given 05/06/19 1257)  thiamine 500mg  in normal saline (50ml) IVPB (500 mg Intravenous New Bag/Given 05/06/19 1300)  LORazepam (ATIVAN) injection 2 mg (2 mg Intravenous Given 05/06/19 0526)  sodium chloride 0.9 % bolus 1,000 mL (0 mLs Intravenous Stopped 05/06/19 0700)    Mobility walks     Focused Assessments Cardiac Assessment Handoff:    No results found for: CKTOTAL, CKMB, CKMBINDEX, TROPONINI No results found for: DDIMER Does the Patient currently have chest pain? No      R Recommendations: See Admitting Provider Note  Report given to:   Additional Notes:

## 2019-05-06 NOTE — H&P (Signed)
History and Physical    Walter Harper TDV:[id number] DOB: [birth year] DOA: 05/06/2019  Referring MD/NP/PA: Jennette Kettle, MD PCP: Patient, No Pcp Per  Patient coming from: Home via EMS  Chief Complaint: Hallucinations  I have personally briefly reviewed patient's old medical records in [place]   HPI: Walter Harper is a 43 y.o. male with medical history significant of hyperlipidemia, TIA, alcohol abuse and cocaine use; who presents with complaints of hallucinations.  Patient just was discharged from hospital on 8/29 for suspected TIA. Recommended to take aspirin and Plavix for 3 weeks, and then continue on aspirin alone. During the hospitalization patient was noted to be positive for cocaine, but also reported heavy drinking of at least 1/5 of liquor per day on average.  He had stopped drinking abruptly when he came into the hospital and reports that his last drink was approximately 5-6 days ago.  Much of the history is obtained from the patient's wife is present at bedside.  Reportedly since he was discharged home he has been hearing and seeing things that are not there.  He reportedly thought someone kidnapped his daughter and called 911.  When EMS showed up to the house his wife insisted that they bring him here to the hospital for further evaluation.  The patient continues to say that his new medications are causing his symptoms.  Associated symptoms include decreased p.o. intake, formication, and insomnia over the last 3 days.  Denies having any nausea, vomiting, diarrhea, chest pain, abdominal pain, or dysuria symptoms.   ED Course: Upon admission into the emergency department patient was noted to be tachycardic and hypertensive.  Labs significant for potassium of 2.9.  Negative for COVID-19 to 4 days ago.  Patient was given 40 mg of potassium chloride p.o., 1 L normal saline IV fluids, and Ativan for alcohol withdrawals.  Review of systems: A complete 10 point review of  systems was performed and negative except for as noted above in HPI.  Past Medical History:  Diagnosis Date  . Alcohol abuse   . Hyperlipidemia   . TIA (transient ischemic attack)     History reviewed. No pertinent surgical history.   reports that he has never smoked. He has never used smokeless tobacco. He reports current alcohol use. No history on file for drug.  No Known Allergies  No family history on file.  Prior to Admission medications   Medication Sig Start Date End Date Taking? Authorizing Provider  aspirin EC 81 MG EC tablet Take 1 tablet (81 mg total) by mouth daily. 05/04/19  Yes Aline August, MD  atorvastatin (LIPITOR) 20 MG tablet Take 1 tablet (20 mg total) by mouth daily at 6 PM. 05/03/19  Yes Starla Link, Kshitiz, MD  cetirizine (ZYRTEC) 10 MG tablet Take 10 mg by mouth daily as needed for allergies.   Yes [provider]  clopidogrel (PLAVIX) 75 MG tablet Take 1 tablet (75 mg total) by mouth daily. 05/04/19  Yes Aline August, MD  folic acid (FOLVITE) 1 MG tablet Take 1 tablet (1 mg total) by mouth daily. 05/04/19  Yes Aline August, MD  Multiple Vitamin (MULTIVITAMIN WITH MINERALS) TABS tablet Take 1 tablet by mouth daily. 05/04/19  Yes Aline August, MD  thiamine 100 MG tablet Take 1 tablet (100 mg total) by mouth daily. 05/04/19  Yes Aline August, MD    Physical Exam:  Constitutional: Middle-aged male who appears to be uncomfortable Vitals:   05/06/19 0615 05/06/19 0630 05/06/19 0645 05/06/19 0700  BP: 134/84 128/88 (!) 143/93 134/88  Pulse:   97 91  SpO2:   100% 99%   Eyes: Conjunctival injection bilaterally with dilated pupils ENMT: Mucous membranes are moist. Posterior pharynx clear of any exudate or lesions.Normal dentition.  Neck: normal, supple, no masses, no thyromegaly Respiratory: clear to auscultation bilaterally, no wheezing, no crackles. Normal respiratory effort. No accessory muscle use.  Cardiovascular: Regular rate and rhythm, no  murmurs / rubs / gallops. No extremity edema. 2+ pedal pulses. No carotid bruits.  Abdomen: no tenderness, no masses palpated. No hepatosplenomegaly. Bowel sounds positive.  Musculoskeletal: no clubbing / cyanosis. No joint deformity upper and lower extremities. Good ROM, no contractures. Normal muscle tone.  Skin: no rashes, lesions, ulcers. No induration Neurologic: CN 2-12 grossly intact. Sensation intact, DTR normal. Strength 5/5 in all 4.  Psychiatric: Patient appears paranoid although alert and oriented x3.     Labs on Admission: I have personally reviewed following labs and imaging studies  CBC: Recent Labs  Lab 05/01/19 1904 05/01/19 1911 05/02/19 0825 05/03/19 0408 05/06/19 0524  WBC 9.8  --  5.5 4.7 7.1  NEUTROABS 8.1*  --  3.4 2.5  --   HGB 16.3 16.7 15.2 15.6 15.2  HCT 46.5 49.0 42.9 45.6 42.7  MCV 95.1  --  95.3 97.4 96.2  PLT 268  --  230 219 257   Basic Metabolic Panel: Recent Labs  Lab 05/01/19 1904 05/01/19 1911 05/02/19 0825 05/03/19 0408 05/06/19 0524  NA 140 139 138 139 138  K 2.8* 2.9* 2.7* 3.4* 2.9*  CL 94* 95* 96* 99 102  CO2 28  --  30 31 24   GLUCOSE 123* 122* 96 102* 131*  BUN 8 6 5* 9 13  CREATININE 0.82 0.70 0.73 0.79 0.80  CALCIUM 9.7  --  9.3 9.3 9.2  MG  --   --  2.0 2.1  --    GFR: CrCl cannot be calculated (Unknown ideal weight.). Liver Function Tests: Recent Labs  Lab 05/01/19 1904 05/02/19 0825 05/03/19 0408 05/06/19 0524  AST 58* 40 30 32  ALT 39 31 28 32  ALKPHOS 71 59 62 57  BILITOT 1.1 1.6* 1.3* 0.6  PROT 8.4* 6.8 6.3* 7.2  ALBUMIN 5.0 3.9 3.8 4.3   No results for input(s): LIPASE, AMYLASE in the last 168 hours. No results for input(s): AMMONIA in the last 168 hours. Coagulation Profile: Recent Labs  Lab 05/01/19 1904  INR 1.0   Cardiac Enzymes: No results for input(s): CKTOTAL, CKMB, CKMBINDEX, TROPONINI in the last 168 hours. BNP (last 3 results) No results for input(s): PROBNP in the last 8760 hours.  HbA1C: No results for input(s): HGBA1C in the last 72 hours. CBG: No results for input(s): GLUCAP in the last 168 hours. Lipid Profile: No results for input(s): CHOL, HDL, LDLCALC, TRIG, CHOLHDL, LDLDIRECT in the last 72 hours. Thyroid Function Tests: No results for input(s): TSH, T4TOTAL, FREET4, T3FREE, THYROIDAB in the last 72 hours. Anemia Panel: No results for input(s): VITAMINB12, FOLATE, FERRITIN, TIBC, IRON, RETICCTPCT in the last 72 hours. Urine analysis:    Component Value Date/Time   COLORURINE AMBER (A) 05/01/2019 1904   APPEARANCEUR CLEAR 05/01/2019 1904   LABSPEC 1.024 05/01/2019 1904   PHURINE 5.0 05/01/2019 1904   GLUCOSEU 50 (A) 05/01/2019 1904   HGBUR NEGATIVE 05/01/2019 1904   BILIRUBINUR NEGATIVE 05/01/2019 1904   KETONESUR 20 (A) 05/01/2019 1904   PROTEINUR 30 (A) 05/01/2019 1904   NITRITE NEGATIVE 05/01/2019 1904  LEUKOCYTESUR TRACE (A) 05/01/2019 1904   Sepsis Labs: Recent Results (from the past 240 hour(s))  SARS CORONAVIRUS 2 (TAT 6-12 HRS) Nasal Swab Aptima Multi Swab     Status: None   Collection Time: 05/02/19 12:24 AM   Specimen: Aptima Multi Swab; Nasal Swab  Result Value Ref Range Status   SARS Coronavirus 2 NEGATIVE NEGATIVE Final    Comment: (NOTE) SARS-CoV-2 target nucleic acids are NOT DETECTED. The SARS-CoV-2 RNA is generally detectable in upper and lower respiratory specimens during the acute phase of infection. Negative results do not preclude SARS-CoV-2 infection, do not rule out co-infections with other pathogens, and should not be used as the sole basis for treatment or other patient management decisions. Negative results must be combined with clinical observations, patient history, and epidemiological information. The expected result is Negative. Fact Sheet for Patients: HairSlick.no Fact Sheet for Healthcare Providers: quierodirigir.com This test is not yet approved or  cleared by the [place] FDA and  has been authorized for detection and/or diagnosis of SARS-CoV-2 by FDA under an Emergency Use Authorization (EUA). This EUA will remain  in effect (meaning this test can be used) for the duration of the COVID-19 declaration under Section 56 4(b)(1) of the Act, 21 U.S.C. section 360bbb-3(b)(1), unless the authorization is terminated or revoked sooner. Performed at [facility], [address], [place], [place] [address]      Radiological Exams on Admission: No results found.  EKG: Independently reviewed.  Sinus rhythm 84 bpm with incomplete right bundle branch block.  Assessment/Plan Hallucinations and paranoia: Acute.  Patient presents with complaints of hallucinations and paranoia.  Suspect secondary to recent drug use-UDS positive for cocaine and or alcohol withdrawals. -Admit to a medical telemetry bed  -May warrant psychiatry consultation, if symptoms do not improve  Alcohol withdrawal: Patient's last reported drink was reportedly 5 to 6 days ago.  Previously drinking 1/5 of liquor per day on average. -CIWA protocols with scheduled Ativan  -May warrant additional doses of Ativan -High-dose thiamine 500 mg IV x3 days -Encouraged continued cessation of alcohol use  TIA: Patient was just hospitalized in the end of last month for left-sided weakness.  Imaging studies did not show any acute signs of stroke.  Likely provoked from recent cocaine use.  Started on aspirin and Plavix for 3 weeks and then to continue on aspirin alone. -Continue aspirin and Plavix  Polysubstance abuse: Patient was just recently positive for cocaine 3 days ago. -Continue to counsel on need of cessation of illicit drugs  DVT prophylaxis: Lovenox Code Status: Full Family Communication: Discussed plan of care with the patient family present at bedside Disposition Plan: Home Consults called: None Admission status: Observation  Clydie Braun MD [facility] Pager [phone]   If 7PM-7AM, please contact night-coverage [email or web address] Password [email or web address]  05/06/2019, 7:40 AM

## 2019-05-06 NOTE — ED Notes (Signed)
Patient is very paranoid in triage.

## 2019-05-07 DIAGNOSIS — F10231 Alcohol dependence with withdrawal delirium: Principal | ICD-10-CM

## 2019-05-07 LAB — CBC
HCT: 41.5 % (ref 39.0–52.0)
Hemoglobin: 14.2 g/dL (ref 13.0–17.0)
MCH: 33.6 pg (ref 26.0–34.0)
MCHC: 34.2 g/dL (ref 30.0–36.0)
MCV: 98.3 fL (ref 80.0–100.0)
Platelets: 227 10*3/uL (ref 150–400)
RBC: 4.22 MIL/uL (ref 4.22–5.81)
RDW: 13 % (ref 11.5–15.5)
WBC: 5.5 10*3/uL (ref 4.0–10.5)
nRBC: 0 % (ref 0.0–0.2)

## 2019-05-07 LAB — BASIC METABOLIC PANEL
Anion gap: 9 (ref 5–15)
BUN: 6 mg/dL (ref 6–20)
CO2: 27 mmol/L (ref 22–32)
Calcium: 9 mg/dL (ref 8.9–10.3)
Chloride: 104 mmol/L (ref 98–111)
Creatinine, Ser: 0.77 mg/dL (ref 0.61–1.24)
GFR calc Af Amer: >60 mL/min (ref >60)
GFR calc non Af Amer: >60 mL/min (ref >60)
Glucose, Bld: 100 mg/dL — ABNORMAL HIGH (ref 70–99)
Potassium: 3.5 mmol/L (ref 3.5–5.1)
Sodium: 140 mmol/L (ref 135–145)

## 2019-05-07 NOTE — Plan of Care (Signed)
Care Plan needs met 

## 2019-05-07 NOTE — Progress Notes (Signed)
Patient was stable at discharge. I removed their IV. We reviewed the discharge education. Patient/Family verbalized understanding and had no further questions. Patient left with prescription/s in hand.  

## 2019-05-07 NOTE — Progress Notes (Signed)
CSW received consult regarding ETOH use. CSW spoke with patient who was very pleasant. He reported that he does normally drink a lot of alcohol and he has not had any in seven days. He states that he definitely feels better without the alcohol and hopes to stay sober. He accepted resources from [place].   CSW signing off. No further needs identified.   Percell Locus Dameer Speiser LCSW [phone]

## 2019-05-07 NOTE — Discharge Summary (Signed)
Physician Discharge Summary  Walter Harper HQI:[id number] DOB: 07-27-1976 DOA: 05/06/2019  PCP: Patient, No Pcp Per  Admit date: 05/06/2019 Discharge date: 05/07/2019  Time spent: 35 minutes  Recommendations for Outpatient Follow-up:  1. PCP in 1 week 2. [place] [facility] in 3 weeks   Discharge Diagnoses:  Alcohol withdrawal Alcohol abuse   Cocaine abuse (HCC)   Alcohol withdrawal delirium (HCC)   Hallucinations   History of TIA (transient ischemic attack)   Discharge Condition: Improved  Diet recommendation: Heart healthy Filed Weights   05/06/19 1500  Weight: 82.6 kg    History of present illness:  Walter Harper is a 43 y.o. male with medical history significant of hyperlipidemia, TIA, alcohol abuse and cocaine use; who presents with complaints of hallucinations.  Patient just was discharged from hospital on 8/29 for suspected TIA. Recommended to take aspirin and Plavix for 3 weeks, and then continue on aspirin alone. During the hospitalization patient was noted to be positive for cocaine, but also reported heavy drinking of at least 1/5 of liquor per day on average.  He had stopped drinking abruptly when he came into the hospital and reports that his last drink was approximately 5-6 days ago.  Much of the history is obtained from the patient's wife is present at bedside.  Reportedly since he was discharged home he has been hearing and seeing things that are not there.  He reportedly thought someone kidnapped his daughter and called 911.  When EMS showed up to the house his wife insisted that they bring him here to the hospital for further evaluation.  The patient continues to say that his new medications are causing his symptoms  Hospital Course:   Alcohol withdrawal -Admitted with hallucinations and paranoia, tremors, hypertension and tachycardia -Last drink was approximately 5 to 6 days ago at the time of his last admission with TIA, his symptoms started 3 days  prior to admission, was drinking 1/5 of liquor per day on average previously -Treated with Ativan and thiamine with good clinical response -Today patient is AAO x3 without visual hallucinations, continues to insist that his symptoms are secondary to aspirin and Plavix that he was recently discharged on for his TIA instead of EtOH withdrawal -Counseled regarding EtOH cessation  Recent TIA -Admitted with left-sided weakness and numbness which has improved, MRI negative for stroke, seen by neurology, -Felt to be secondary to small vessel disease likely provoked by cocaine use, discharged home on aspirin Plavix for 3 weeks followed by aspirin alone recommended continue this for secondary stroke prevention  Polysubstance abuse -Positive for cocaine 3 days ago, counseled, does not admit to this    Discharge Exam: Vitals:   05/06/19 2107 05/07/19 0521  BP: 120/86 124/80  Pulse: 84 77  Resp: 16 14  Temp: 98.6 F (37 C) 98.4 F (36.9 C)  SpO2: 100%     General: AAOx3 Cardiovascular: S1S2/RRR Respiratory: CTAB  Discharge Instructions   Discharge Instructions    Diet - low sodium heart healthy   Complete by: As directed    Increase activity slowly   Complete by: As directed      Allergies as of 05/07/2019   No Known Allergies     Medication List    TAKE these medications   aspirin 81 MG EC tablet Take 1 tablet (81 mg total) by mouth daily.   atorvastatin 20 MG tablet Commonly known as: LIPITOR Take 1 tablet (20 mg total) by mouth daily at 6 PM.  cetirizine 10 MG tablet Commonly known as: ZYRTEC Take 10 mg by mouth daily as needed for allergies.   clopidogrel 75 MG tablet Commonly known as: PLAVIX Take 1 tablet (75 mg total) by mouth daily.   folic acid 1 MG tablet Commonly known as: FOLVITE Take 1 tablet (1 mg total) by mouth daily.   multivitamin with minerals Tabs tablet Take 1 tablet by mouth daily.   thiamine 100 MG tablet Take 1 tablet (100 mg total) by  mouth daily.      No Known Allergies    The results of significant diagnostics from this hospitalization (including imaging, microbiology, ancillary and laboratory) are listed below for reference.    Significant Diagnostic Studies: Ct Angio Head W Or Wo Contrast  Result Date: 05/02/2019 CLINICAL DATA:  Stroke EXAM: CT ANGIOGRAPHY HEAD AND NECK TECHNIQUE: Multidetector CT imaging of the head and neck was performed using the standard protocol during bolus administration of intravenous contrast. Multiplanar CT image reconstructions and MIPs were obtained to evaluate the vascular anatomy. Carotid stenosis measurements (when applicable) are obtained utilizing NASCET criteria, using the distal internal carotid diameter as the denominator. CONTRAST:  75mL OMNIPAQUE IOHEXOL 350 MG/ML SOLN COMPARISON:  CT head 05/01/2019 FINDINGS: CTA NECK FINDINGS Aortic arch: Bovine branching pattern. Normal aortic arch. Proximal great vessels normal. Right carotid system: Normal right carotid. Negative for stenosis atherosclerotic disease or dissection Left carotid system: Normal left carotid. Negative for stenosis atherosclerotic disease or dissection. Vertebral arteries: Normal vertebral bilaterally. Skeleton: Negative Other neck: Negative Upper chest: Negative Review of the MIP images confirms the above findings CTA HEAD FINDINGS Anterior circulation: Cavernous carotid widely patent bilaterally without stenosis or aneurysm. Anterior and middle cerebral arteries normal bilaterally. Posterior circulation: Both vertebral arteries patent to the basilar. Basilar widely patent. AICA, superior cerebellar, posterior cerebral arteries patent bilaterally without stenosis. Venous sinuses: Normal venous enhancement. Anatomic variants: None Review of the MIP images confirms the above findings IMPRESSION: Normal CTA head neck. No stenosis aneurysm or dissection identified. Normal intracranial circulation. Electronically Signed   By:  Marlan Palauharles  Clark M.D.   On: 05/02/2019 11:54   Ct Angio Neck W Or Wo Contrast  Result Date: 05/02/2019 CLINICAL DATA:  Stroke EXAM: CT ANGIOGRAPHY HEAD AND NECK TECHNIQUE: Multidetector CT imaging of the head and neck was performed using the standard protocol during bolus administration of intravenous contrast. Multiplanar CT image reconstructions and MIPs were obtained to evaluate the vascular anatomy. Carotid stenosis measurements (when applicable) are obtained utilizing NASCET criteria, using the distal internal carotid diameter as the denominator. CONTRAST:  75mL OMNIPAQUE IOHEXOL 350 MG/ML SOLN COMPARISON:  CT head 05/01/2019 FINDINGS: CTA NECK FINDINGS Aortic arch: Bovine branching pattern. Normal aortic arch. Proximal great vessels normal. Right carotid system: Normal right carotid. Negative for stenosis atherosclerotic disease or dissection Left carotid system: Normal left carotid. Negative for stenosis atherosclerotic disease or dissection. Vertebral arteries: Normal vertebral bilaterally. Skeleton: Negative Other neck: Negative Upper chest: Negative Review of the MIP images confirms the above findings CTA HEAD FINDINGS Anterior circulation: Cavernous carotid widely patent bilaterally without stenosis or aneurysm. Anterior and middle cerebral arteries normal bilaterally. Posterior circulation: Both vertebral arteries patent to the basilar. Basilar widely patent. AICA, superior cerebellar, posterior cerebral arteries patent bilaterally without stenosis. Venous sinuses: Normal venous enhancement. Anatomic variants: None Review of the MIP images confirms the above findings IMPRESSION: Normal CTA head neck. No stenosis aneurysm or dissection identified. Normal intracranial circulation. Electronically Signed   By: Marlan Palauharles  Clark M.D.  On: 05/02/2019 11:54   Mr Brain Wo Contrast  Result Date: 05/02/2019 CLINICAL DATA:  Left arm numbness beginning yesterday. Negative CT angiography. EXAM: MRI HEAD WITHOUT  CONTRAST TECHNIQUE: Multiplanar, multiecho pulse sequences of the brain and surrounding structures were obtained without intravenous contrast. COMPARISON:  CT studies yesterday and today. FINDINGS: Brain: The brain has a normal appearance without evidence of malformation, atrophy, old or acute small or large vessel infarction, mass lesion, hemorrhage, hydrocephalus or extra-axial collection. Vascular: Major vessels at the base of the brain show flow. Venous sinuses appear patent. Skull and upper cervical spine: Normal. Sinuses/Orbits: Clear/normal. Other: None significant. IMPRESSION: Normal examination. No abnormality seen to explain the clinical presentation. Electronically Signed   By: Paulina FusiMark  Shogry M.D.   On: 05/02/2019 16:08   Ct Head Code Stroke Wo Contrast  Result Date: 05/01/2019 CLINICAL DATA:  Code stroke. Left sided arm numbness beginning 1800 hours EXAM: CT HEAD WITHOUT CONTRAST TECHNIQUE: Contiguous axial images were obtained from the base of the skull through the vertex without intravenous contrast. COMPARISON:  None. FINDINGS: Brain: Normal appearance without evidence of atrophy, old or acute infarction, mass lesion, hemorrhage, hydrocephalus or extra-axial collection. Vascular: No abnormal vascular finding. Skull: Negative Sinuses/Orbits: Clear/normal Other: Sebaceous cysts of the scalp. ASPECTS [facility]([place] Stroke Program Early CT Score) - Ganglionic level infarction (caudate, lentiform nuclei, internal capsule, insula, M1-M3 cortex): 7 - Supraganglionic infarction (M4-M6 cortex): 3 Total score (0-10 with 10 being normal): 10 IMPRESSION: 1. No acute CT finding.  Normal appearance of the brain. 2. ASPECTS is 10. 3. These results were called by telephone at the time of interpretation on 05/01/2019 at 7:16 pm to Dr. Raeford RazorSTEPHEN KOHUT , who verbally acknowledged these results. Electronically Signed   By: Paulina FusiMark  Shogry M.D.   On: 05/01/2019 19:18    Microbiology: Recent Results (from the past 240 hour(s))   SARS CORONAVIRUS 2 (TAT 6-12 HRS) Nasal Swab Aptima Multi Swab     Status: None   Collection Time: 05/02/19 12:24 AM   Specimen: Aptima Multi Swab; Nasal Swab  Result Value Ref Range Status   SARS Coronavirus 2 NEGATIVE NEGATIVE Final    Comment: (NOTE) SARS-CoV-2 target nucleic acids are NOT DETECTED. The SARS-CoV-2 RNA is generally detectable in upper and lower respiratory specimens during the acute phase of infection. Negative results do not preclude SARS-CoV-2 infection, do not rule out co-infections with other pathogens, and should not be used as the sole basis for treatment or other patient management decisions. Negative results must be combined with clinical observations, patient history, and epidemiological information. The expected result is Negative. Fact Sheet for Patients: HairSlick.nohttps://www.fda.gov/media/138098/download Fact Sheet for Healthcare Providers: quierodirigir.comhttps://www.fda.gov/media/138095/download This test is not yet approved or cleared by the [place] FDA and  has been authorized for detection and/or diagnosis of SARS-CoV-2 by FDA under an Emergency Use Authorization (EUA). This EUA will remain  in effect (meaning this test can be used) for the duration of the COVID-19 declaration under Section 56 4(b)(1) of the Act, 21 U.S.C. section 360bbb-3(b)(1), unless the authorization is terminated or revoked sooner. Performed at [facility], [address] [place], [place] [address]      Labs: Basic Metabolic Panel: Recent Labs  Lab 05/01/19 1904 05/01/19 1911 05/02/19 0825 05/03/19 0408 05/06/19 0524 05/07/19 0212  NA 140 139 138 139 138 140  K 2.8* 2.9* 2.7* 3.4* 2.9* 3.5  CL 94* 95* 96* 99 102 104  CO2 28  --  30 31 24  27  GLUCOSE 123* 122* 96 102* 131* 100*  BUN 8 6 5* 9 13 6   CREATININE 0.82 0.70 0.73 0.79 0.80 0.77  CALCIUM 9.7  --  9.3 9.3 9.2 9.0  MG  --   --  2.0 2.1 1.9  --    Liver Function Tests: Recent Labs  Lab 05/01/19 1904  05/02/19 0825 05/03/19 0408 05/06/19 0524  AST 58* 40 30 32  ALT 39 31 28 32  ALKPHOS 71 59 62 57  BILITOT 1.1 1.6* 1.3* 0.6  PROT 8.4* 6.8 6.3* 7.2  ALBUMIN 5.0 3.9 3.8 4.3   No results for input(s): LIPASE, AMYLASE in the last 168 hours. No results for input(s): AMMONIA in the last 168 hours. CBC: Recent Labs  Lab 05/01/19 1904 05/01/19 1911 05/02/19 0825 05/03/19 0408 05/06/19 0524 05/07/19 0212  WBC 9.8  --  5.5 4.7 7.1 5.5  NEUTROABS 8.1*  --  3.4 2.5  --   --   HGB 16.3 16.7 15.2 15.6 15.2 14.2  HCT 46.5 49.0 42.9 45.6 42.7 41.5  MCV 95.1  --  95.3 97.4 96.2 98.3  PLT 268  --  230 219 257 227   Cardiac Enzymes: No results for input(s): CKTOTAL, CKMB, CKMBINDEX, TROPONINI in the last 168 hours. BNP: BNP (last 3 results) No results for input(s): BNP in the last 8760 hours.  ProBNP (last 3 results) No results for input(s): PROBNP in the last 8760 hours.  CBG: No results for input(s): GLUCAP in the last 168 hours.     Signed:  Domenic Polite MD.  [facility] 05/07/2019, 4:45 PM

## 2019-05-15 ENCOUNTER — Ambulatory Visit (HOSPITAL_COMMUNITY)
Admission: RE | Admit: 2019-05-15 | Discharge: 2019-05-15 | Disposition: A | Discharge status: Home / Self Care | Payer: BLUE CROSS/BLUE SHIELD | Attending: Psychiatry | Admitting: Psychiatry

## 2019-05-15 DIAGNOSIS — R443 Hallucinations, unspecified: Secondary | ICD-10-CM | POA: Diagnosis present

## 2019-05-15 NOTE — BH Assessment (Signed)
Assessment Note  Walter Harper is an 43 y.o. male. Pt reports having a stroke and then being prescribed medications that have been causing VH. Pt denies SI/HI. Pt states his paranoia has increased and he feels he is experiencing things that are not really happening. Pt was oriented x4 and he states he is fully functioning and can complete day-to-day tasks. Pt states he began to feel something was different after his stroke and after he was prescribed Lipitor. The Pt denies previous psych hx.   Tinnie Gens, NP recommended D/C and to make an appointment with a neurologist.   Diagnosis:  F43.20 Adjustment disorder  Past Medical History:  Past Medical History:  Diagnosis Date  . Alcohol abuse   . Hyperlipidemia   . TIA (transient ischemic attack)     No past surgical history on file.  Family History: No family history on file.  Social History:  reports that he has never smoked. He has never used smokeless tobacco. He reports current alcohol use. No history on file for drug.  Additional Social History:  Alcohol / Drug Use Pain Medications: please see mar Prescriptions: please see mar Over the Counter: please see mar History of alcohol / drug use?: Yes Longest period of sobriety (when/how long): 14 days Negative Consequences of Use: Financial, Legal, Personal relationships, Work / School Substance #1 Name of Substance 1: alcohol 1 - Age of First Use: unknown 1 - Amount (size/oz): unknown 1 - Frequency: unknown 1 - Duration: unknown 1 - Last Use / Amount: unknown  CIWA:   COWS:    Allergies: No Known Allergies  Home Medications: (Not in a hospital admission)   OB/GYN Status:  No LMP for male patient.  General Assessment Data [facility]: [facility] Assessment: In system Is this a Tele or Face-to-Face Assessment?: Tele Assessment Is this an Initial Assessment or a Re-assessment for this encounter?: Initial Assessment Patient Accompanied by:: Other Language  Other than English: No Living Arrangements: Other (Comment) What gender do you identify as?: Male Marital status: Divorced Hayden name: NA Pregnancy Status: No Living Arrangements: Spouse/significant other, Children Can pt return to current living arrangement?: Yes Admission Status: Voluntary Is patient capable of signing voluntary admission?: Yes Referral Source: Self/Family/Friend Insurance type: SP  Medical Screening Exam ([place]) Medical Exam completed: Yes  Crisis Care Plan Living Arrangements: Spouse/significant other, Children Legal Guardian: Other:(self) Name of Psychiatrist: NA Name of Therapist: NA  Education Status Is patient currently in school?: No Is the patient employed, unemployed or receiving disability?: Unemployed  Risk to self with the past 6 months Suicidal Ideation: No Has patient been a risk to self within the past 6 months prior to admission? : No Suicidal Intent: No Has patient had any suicidal intent within the past 6 months prior to admission? : No Is patient at risk for suicide?: No Suicidal Plan?: No Has patient had any suicidal plan within the past 6 months prior to admission? : No Access to Means: No What has been your use of drugs/alcohol within the last 12 months?: NA Previous Attempts/Gestures: No How many times?: 0 Other Self Harm Risks: NA Triggers for Past Attempts: None known Intentional Self Injurious Behavior: None Family Suicide History: No Recent stressful life event(s): Other (Comment)(stroke) Persecutory voices/beliefs?: No Depression: No Depression Symptoms: (pt denies) Substance abuse history and/or treatment for substance abuse?: No Suicide prevention information given to non-admitted patients: Not applicable  Risk to Others within the past 6 months Homicidal Ideation: No  Does patient have any lifetime risk of violence toward others beyond the six months prior to admission? : No Thoughts of Harm to Others:  No Current Homicidal Intent: No Current Homicidal Plan: No Access to Homicidal Means: No Identified Victim: NA History of harm to others?: No Assessment of Violence: None Noted Violent Behavior Description: NA Does patient have access to weapons?: No Criminal Charges Pending?: No Does patient have a court date: No Is patient on probation?: No  Psychosis Hallucinations: Visual Delusions: None noted  Mental Status Report Appearance/Hygiene: Unremarkable Eye Contact: Fair Motor Activity: Freedom of movement Speech: Logical/coherent Level of Consciousness: Alert Mood: Anxious Affect: Anxious Anxiety Level: Moderate Thought Processes: Coherent, Relevant Judgement: Unimpaired Orientation: Person, Place, Time, Situation Obsessive Compulsive Thoughts/Behaviors: None  Cognitive Functioning Concentration: Normal Memory: Recent Intact, Remote Intact Is patient IDD: No Insight: Fair Impulse Control: Fair Appetite: Fair Have you had any weight changes? : No Change Sleep: No Change Total Hours of Sleep: 8 Vegetative Symptoms: None  ADLScreening [facility] Assessment Services) Patient's cognitive ability adequate to safely complete daily activities?: Yes Patient able to express need for assistance with ADLs?: No Independently performs ADLs?: Yes (appropriate for developmental age)  Prior Inpatient Therapy Prior Inpatient Therapy: No  Prior Outpatient Therapy Prior Outpatient Therapy: No Does patient have an ACCT team?: No Does patient have Intensive In-House Services?  : No Does patient have [facility] services? : No Does patient have P4CC services?: No  ADL Screening (condition at time of admission) Patient's cognitive ability adequate to safely complete daily activities?: Yes Is the patient deaf or have difficulty hearing?: No Does the patient have difficulty seeing, even when wearing glasses/contacts?: No Does the patient have difficulty concentrating, remembering, or making  decisions?: No Patient able to express need for assistance with ADLs?: No Does the patient have difficulty dressing or bathing?: No Independently performs ADLs?: Yes (appropriate for developmental age)       Abuse/Neglect Assessment (Assessment to be complete while patient is alone) Abuse/Neglect Assessment Can Be Completed: Yes Physical Abuse: Denies Verbal Abuse: Denies Sexual Abuse: Denies Exploitation of patient/patient's resources: Denies     Merchant navy officerAdvance Directives (For Healthcare) Does Patient Have a Medical Advance Directive?: No Would patient like information on creating a medical advance directive?: No - Patient declined          Disposition:  Disposition Initial Assessment Completed for this Encounter: Yes Disposition of Patient: Discharge  On Site Evaluation by:   Reviewed with Physician:    Emmit PomfretLevette,Myalee Stengel D 05/15/2019 5:42 PM

## 2019-05-15 NOTE — H&P (Signed)
Behavioral Health Medical Screening Exam  Walter Harper is an 43 y.o. male.who presents to [place] voluntarily, accompanied alone. Patient denies any active or passive suicidal thoughts with plan or intent. Denies homicidal ideations.He endorses that two weeks ago, he had a TIA and was hospitalized. Reports following the TIA and after discharge from he hospital, he was started on medication one of which was Lipitor. He reports shortly after starting the Lipitor he begin to have [place] described as, " seeing things that not really there" and he describes no other details. He reports the hallucinations mostly occur at night, " after 12:00 am"  He then states that he is unsure if the hallucinations are related to the possibility that, " somebody slipped a drug in my drink." He denies alcohol use within the last 14 days. Per review of chart, he went to the ED on 05/06/2019 with complaints of hallucinations, was diagnosed with alcohol delirium, and it was found that he had cocaine in his system. He states that he does not know how the cocaine got in his system and that's where he believes that someone had put something in his drink. Throughout this assessment, he most states that it is the new drugs that are causing his symptoms. He denies any psychiatric history such as SA, self harm, or inpatient/outpatient psychiatric care. He does not appear internally preoccupied and there are no other psychotic process observed.    Total Time spent with patient: 20 minutes  Psychiatric Specialty Exam: Physical Exam  Vitals reviewed. Constitutional: He is oriented to person, place, and time.  Neurological: He is alert and oriented to person, place, and time.    Review of Systems  Psychiatric/Behavioral: Positive for hallucinations.  All other systems reviewed and are negative.   There were no vitals taken for this visit.There is no height or weight on file to calculate BMI.  General Appearance: Fairly Groomed   Eye Contact:  Good  Speech:  Clear and Coherent and Normal Rate  Volume:  Normal  Mood:  Anxious  Affect:  Appropriate  Thought Process:  Coherent, Linear and Descriptions of Associations: Intact  Orientation:  Full (Time, Place, and Person)  Thought Content:  Hallucinations: Visual  Suicidal Thoughts:  No  Homicidal Thoughts:  No  Memory:  Immediate;   Fair Recent;   Fair  Judgement:  Fair  Insight:  Fair  Psychomotor Activity:  Normal  Concentration: Concentration: Fair and Attention Span: Fair  Recall:  [employer] of Knowledge:Fair  Language: Good  Akathisia:  Negative  Handed:  Right  AIMS (if indicated):     Assets:  Communication Skills Desire for Improvement Resilience  Sleep:       Musculoskeletal: Strength & Muscle Tone: within normal limits Gait & Station: normal Patient leans: N/A  There were no vitals taken for this visit.  Recommendations:  Based on my evaluation the patient does not appear to have an emergency medical condition.   Patient presents with no evidence of imminent risk to self or others.He does not meet criteria for psychiatric inpatient admission. Patient denies feelings of withdrawal. He reports he has not drank any alcohol in the last 14 days. He denies recent cocaine use or other substance abuse or use. Following his hospitalization for TIA, he states he was advised to follow-up with a neurologists. He was encouraged to make this appointment. In regard to his thoughts that his medications are causing him to have hallucinations, he was advised to speak to  the prescriber of his medications about his concerns. In regards to his alcohol abuse, abstinence from alcohol was encouraged.    Denzil MagnusonLaShunda Cherica Heiden, NP 05/15/2019, 3:43 PM

## 2019-05-29 ENCOUNTER — Emergency Department (HOSPITAL_COMMUNITY)
Admission: EM | Admit: 2019-05-29 | Discharge: 2019-05-30 | Disposition: A | Discharge status: Home / Self Care | Payer: BLUE CROSS/BLUE SHIELD | Attending: Emergency Medicine | Admitting: Emergency Medicine

## 2019-05-29 ENCOUNTER — Other Ambulatory Visit: Payer: Self-pay

## 2019-05-29 ENCOUNTER — Encounter (HOSPITAL_COMMUNITY): Payer: Self-pay | Admitting: Behavioral Health

## 2019-05-29 ENCOUNTER — Ambulatory Visit (HOSPITAL_COMMUNITY)
Admission: RE | Admit: 2019-05-29 | Discharge: 2019-05-29 | Disposition: A | Discharge status: Home / Self Care | Payer: BLUE CROSS/BLUE SHIELD | Source: Home / Self Care | Attending: Psychiatry | Admitting: Psychiatry

## 2019-05-29 ENCOUNTER — Encounter (HOSPITAL_COMMUNITY): Payer: Self-pay | Admitting: Emergency Medicine

## 2019-05-29 DIAGNOSIS — Z7902 Long term (current) use of antithrombotics/antiplatelets: Secondary | ICD-10-CM | POA: Diagnosis not present

## 2019-05-29 DIAGNOSIS — Z8673 Personal history of transient ischemic attack (TIA), and cerebral infarction without residual deficits: Secondary | ICD-10-CM | POA: Diagnosis not present

## 2019-05-29 DIAGNOSIS — Z7982 Long term (current) use of aspirin: Secondary | ICD-10-CM | POA: Diagnosis not present

## 2019-05-29 DIAGNOSIS — Z20828 Contact with and (suspected) exposure to other viral communicable diseases: Secondary | ICD-10-CM | POA: Insufficient documentation

## 2019-05-29 DIAGNOSIS — F309 Manic episode, unspecified: Secondary | ICD-10-CM | POA: Diagnosis not present

## 2019-05-29 DIAGNOSIS — R462 Strange and inexplicable behavior: Secondary | ICD-10-CM | POA: Diagnosis present

## 2019-05-29 LAB — CBC WITH DIFFERENTIAL/PLATELET
Abs Immature Granulocytes: 0.02 10*3/uL (ref 0.00–0.07)
Basophils Absolute: 0 10*3/uL (ref 0.0–0.1)
Basophils Relative: 0 %
Eosinophils Absolute: 0 10*3/uL (ref 0.0–0.5)
Eosinophils Relative: 0 %
HCT: 47.5 % (ref 39.0–52.0)
Hemoglobin: 16.4 g/dL (ref 13.0–17.0)
Immature Granulocytes: 0 %
Lymphocytes Relative: 22 %
Lymphs Abs: 2 10*3/uL (ref 0.7–4.0)
MCH: 32.9 pg (ref 26.0–34.0)
MCHC: 34.5 g/dL (ref 30.0–36.0)
MCV: 95.4 fL (ref 80.0–100.0)
Monocytes Absolute: 0.6 10*3/uL (ref 0.1–1.0)
Monocytes Relative: 7 %
Neutro Abs: 6.3 10*3/uL (ref 1.7–7.7)
Neutrophils Relative %: 71 %
Platelets: 296 10*3/uL (ref 150–400)
RBC: 4.98 MIL/uL (ref 4.22–5.81)
RDW: 11.5 % (ref 11.5–15.5)
WBC: 9 10*3/uL (ref 4.0–10.5)
nRBC: 0 % (ref 0.0–0.2)

## 2019-05-29 LAB — COMPREHENSIVE METABOLIC PANEL
ALT: 18 U/L (ref 0–44)
AST: 18 U/L (ref 15–41)
Albumin: 4.7 g/dL (ref 3.5–5.0)
Alkaline Phosphatase: 62 U/L (ref 38–126)
Anion gap: 10 (ref 5–15)
BUN: 10 mg/dL (ref 6–20)
CO2: 26 mmol/L (ref 22–32)
Calcium: 9.5 mg/dL (ref 8.9–10.3)
Chloride: 102 mmol/L (ref 98–111)
Creatinine, Ser: 0.87 mg/dL (ref 0.61–1.24)
GFR calc Af Amer: >60 mL/min (ref >60)
GFR calc non Af Amer: >60 mL/min (ref >60)
Glucose, Bld: 116 mg/dL — ABNORMAL HIGH (ref 70–99)
Potassium: 3.5 mmol/L (ref 3.5–5.1)
Sodium: 138 mmol/L (ref 135–145)
Total Bilirubin: 0.8 mg/dL (ref 0.3–1.2)
Total Protein: 8 g/dL (ref 6.5–8.1)

## 2019-05-29 LAB — LITHIUM LEVEL: Lithium Lvl: <0.06 mmol/L — ABNORMAL LOW (ref 0.60–1.20)

## 2019-05-29 LAB — ETHANOL: Alcohol, Ethyl (B): <10 mg/dL (ref <10)

## 2019-05-29 LAB — SARS CORONAVIRUS 2 BY RT PCR (HOSPITAL ORDER, PERFORMED IN ~~LOC~~ HOSPITAL LAB): SARS Coronavirus 2: NEGATIVE

## 2019-05-29 MED ORDER — RISPERIDONE 1 MG PO TBDP: 2.0000 mg | ORAL_TABLET | Freq: Three times a day (TID) | ORAL | Status: DC | PRN

## 2019-05-29 MED ORDER — LORAZEPAM 1 MG PO TABS
2.0000 mg | ORAL_TABLET | Freq: Once | ORAL | Status: AC
  Administered 2019-05-29: 2 mg via ORAL
  Filled 2019-05-29: qty 2

## 2019-05-29 MED ORDER — ZIPRASIDONE MESYLATE 20 MG IM SOLR: 20.0000 mg | INTRAMUSCULAR | Status: DC | PRN

## 2019-05-29 MED ORDER — LORAZEPAM 1 MG PO TABS: 1.0000 mg | ORAL_TABLET | ORAL | Status: DC | PRN

## 2019-05-29 MED ORDER — OLANZAPINE 10 MG PO TABS
10.0000 mg | ORAL_TABLET | Freq: Once | ORAL | Status: AC
  Administered 2019-05-29: 16:00:00 10 mg via ORAL
  Filled 2019-05-29: qty 1

## 2019-05-29 NOTE — H&P (Addendum)
Behavioral Health Medical Screening Exam  Walter Harper is an 43 y.o. male. Patient brought in by Mother, Albina Billet [phone], patient denies SI and HI at this time. Patient appears to have AVH.  Patient appears anxious, states "there are voices and shit in here (gestures toward head)." ) Patient states "the TV has been talking to me, I love everybody, look I just want to think for a minute." Patient tangential, poor historian, states "I had a stroke on August 27th." Patient alert at this time, appears confused. Patient is not taking home medications at this time, unable to articulate reasons. Patient meets criteria for inpatient psychiatric admission, requires medical clearance prior to admission.   Total Time spent with patient: 20 minutes  Psychiatric Specialty Exam: Physical Exam  Nursing note and vitals reviewed. Constitutional: He is oriented to person, place, and time. He appears well-developed and well-nourished.  HENT:  Head: Normocephalic.  Eyes: Pupils are equal, round, and reactive to light.  Neck: Normal range of motion.  Respiratory: Effort normal.  Musculoskeletal: Normal range of motion.  Neurological: He is alert and oriented to person, place, and time.  Psychiatric: His behavior is normal. Judgment normal. His mood appears anxious. His speech is tangential. Thought content is paranoid. Cognition and memory are impaired.    Review of Systems  Constitutional: Negative.   HENT: Ear pain: Patient gestures toward ears, "my ears hurt"   Eyes: Negative.   Respiratory: Negative.   Cardiovascular: Negative.   Gastrointestinal: Negative.   Genitourinary: Negative.   Musculoskeletal: Negative.   Skin: Negative.   Neurological:       I had a stroke on August 27th of this year  Endo/Heme/Allergies: Negative.   Psychiatric/Behavioral: The patient is nervous/anxious.     Blood pressure (!) 156/94, pulse 90, temperature 98.2 F (36.8 C), resp. rate 18.There is no  height or weight on file to calculate BMI.  General Appearance: Casual  Eye Contact:  Fair  Speech:  Slow  Volume:  Normal  Mood:  Anxious  Affect:  Non-Congruent  Thought Process:  Disorganized and Descriptions of Associations: Tangential  Orientation:  Full (Time, Place, and Person)  Thought Content:  Paranoid Ideation and Tangential  Suicidal Thoughts:  No  Homicidal Thoughts:  No  Memory:  Immediate;   Fair Recent;   Fair Remote;   Fair  Judgement:  Impaired  Insight:  Lacking  Psychomotor Activity:  Normal  Concentration: Concentration: Fair and Attention Span: Fair  Recall:  Poor  Fund of Knowledge:Fair  Language: Fair  Akathisia:  No  Handed:  Right  AIMS (if indicated):     Assets:  Housing Social Support Transportation  Sleep:       Musculoskeletal: Strength & Muscle Tone: within normal limits Gait & Station: normal Patient leans: N/A  Blood pressure (!) 156/94, pulse 90, temperature 98.2 F (36.8 C), resp. rate 18.  Recommendations:  Based on my evaluation the patient appears to have an emergency medical condition for which I recommend the patient be transferred to the emergency department for further evaluation.  Emmaline Kluver, [place] 05/29/2019, 1:27 PM   Patient's chart reviewed. Reviewed the information documented and agree with the treatment plan.  Buford Dresser, DO 05/29/19 6:21 PM

## 2019-05-29 NOTE — ED Notes (Signed)
PT REFUSED COVID TEST AS ATTEMPTED BY RN BETH AT [facility].

## 2019-05-29 NOTE — ED Notes (Signed)
Pt A&O x 4, paranoid, coming to door frequently looking into the hallway.  Calm & cooperative.  Monitoring for safety, no distress noted. 1-1 sitter at bedside.

## 2019-05-29 NOTE — ED Triage Notes (Signed)
Patient BIB mother, reports patient has not slept in two days. States patient had TIA last month. Hx alcohol abuse. Mother reports last drink 8/27.

## 2019-05-29 NOTE — ED Notes (Signed)
ED Provider at bedside. 

## 2019-05-29 NOTE — BH Assessment (Addendum)
Assessment Note  Walter Harper is an 43 y.o. male who presented to [facility] as a walk-in with his mother.  Patient was very disorganized, his thoughts racing and he was unable to focus.  He stated that he had been hearing voices and stated that this was the first time that he had ever experienced this and he stated, "I don't know what is wrong with me."  Patient indicating that he was saying things he has never said before and could not identify why he was saying them.  He stated that half of his ear felt numb as well..  Patient was acting very bizarre and stated that he really did not feel right.  He denied SI/HI.  Patient states that he recently at a TIA and was hospitalized at [facility].  He is scheduled to see a neurologist tomorrow.  Patient admits to a history of drinking, but states that he has not had any alcohol since 8/26.   Patient was highly anxious and panicked during his assessment.  He was restless and seemed to be very fearful at times.  His presentation was very bizarre.  He was so distressed at times that he was almost tearful.  Patient was alert and oriented, his mood and affect were depressed.  Patient appeared to be delusional and responding to internal stimuli.  His judgment, insight and impulse control were impaired.  His eye contact was good, but his speech was pressured.  TTS spoke to patient's mother, Albina Billet, who reported that patient has not been sleeping for several days, he has been nervous and agitated.  Other states that patient has a history of heavy drinking, but states that patient has not been drinking since August 26th.  She states that he has been reporting that people are following him, he has been very delusional and thinking that his family has mites in their ears and he has been touching their ears.  He thinks that there are cameras in the home and he is seeing people and things.  Mother states that patient does not use drugs, but deals cocaine and  She is  concerned that it may have gotten into his system and is causing this disturbance.  Diagnosis: F23 Brief Psychotic Disorder  Past Medical History:  Past Medical History:  Diagnosis Date  . Alcohol abuse   . Hyperlipidemia   . TIA (transient ischemic attack)     No past surgical history on file.  Family History: No family history on file.  Social History:  reports that he has never smoked. He has never used smokeless tobacco. He reports current alcohol use. He reports that he does not use drugs.  Additional Social History:  Alcohol / Drug Use Pain Medications: see MAR Prescriptions: see MAR Over the Counter: see MAR History of alcohol / drug use?: Yes Longest period of sobriety (when/how long): since August 26th Negative Consequences of Use: Financial, Personal relationships Substance #1 Name of Substance 1: alcohol 1 - Age of First Use: unknown 1 - Amount (size/oz): fifth 1 - Frequency: daily 1 - Duration: unknown 1 - Last Use / Amount: August 26th  CIWA: CIWA-Ar BP: (!) 156/94(nurse notified) Pulse Rate: 90 COWS:    Allergies: No Known Allergies  Home Medications: (Not in a hospital admission)   OB/GYN Status:  No LMP for male patient.  General Assessment Data [facility] Assessment: In system Is this a Tele or Face-to-Face Assessment?: Tele Assessment Is this an Initial Assessment  or a Re-assessment for this encounter?: Initial Assessment Patient Accompanied by:: Other Language Other than English: No Living Arrangements: Other (Comment) What gender do you identify as?: Male Marital status: Divorced JordanMaiden name: (N/A) Pregnancy Status: No Living Arrangements: Spouse/significant other Can pt return to current living arrangement?: Yes Admission Status: Voluntary Is patient capable of signing voluntary admission?: Yes Referral Source: Self/Family/Friend Insurance type: (self-pay)  Medical Screening Exam [facility] Walk-in ONLY) Medical  Exam completed: Yes  Crisis Care Plan Living Arrangements: Spouse/significant other Legal Guardian: Other: Name of Psychiatrist: none Name of Therapist: none  Education Status Is patient currently in school?: No Is the patient employed, unemployed or receiving disability?: Unemployed  Risk to self with the past 6 months Suicidal Ideation: No Has patient been a risk to self within the past 6 months prior to admission? : No Suicidal Intent: No Has patient had any suicidal intent within the past 6 months prior to admission? : No Is patient at risk for suicide?: No Suicidal Plan?: No Has patient had any suicidal plan within the past 6 months prior to admission? : No Access to Means: No What has been your use of drugs/alcohol within the last 12 months?: N/A Previous Attempts/Gestures: No How many times?: 0 Other Self Harm Risks: (N/A) Triggers for Past Attempts: None known Intentional Self Injurious Behavior: None Family Suicide History: No Recent stressful life event(s): Other (Comment)(recent TIA) Persecutory voices/beliefs?: No Depression: No Substance abuse history and/or treatment for substance abuse?: No Suicide prevention information given to non-admitted patients: Not applicable  Risk to Others within the past 6 months Homicidal Ideation: No Does patient have any lifetime risk of violence toward others beyond the six months prior to admission? : No Thoughts of Harm to Others: No Current Homicidal Intent: No Current Homicidal Plan: No Access to Homicidal Means: No Identified Victim: N/A History of harm to others?: No Assessment of Violence: None Noted Violent Behavior Description: (N/A) Does patient have access to weapons?: No Criminal Charges Pending?: No Does patient have a court date: No Is patient on probation?: No  Psychosis Hallucinations: Auditory, Visual Delusions: (paranoid delusions)  Mental Status Report Appearance/Hygiene: Unremarkable Eye  Contact: Fair Motor Activity: Freedom of movement Speech: Logical/coherent Level of Consciousness: Alert Mood: Anxious, Suspicious Affect: Anxious, Labile Anxiety Level: Moderate Thought Processes: Coherent, Relevant Judgement: Unimpaired Orientation: Person, Place, Time, Situation Obsessive Compulsive Thoughts/Behaviors: None  Cognitive Functioning Concentration: Normal Memory: Recent Intact, Remote Intact Is patient IDD: No Insight: Poor Impulse Control: Poor Appetite: Poor Have you had any weight changes? : No Change Sleep: No Change Total Hours of Sleep: 0 Vegetative Symptoms: None  ADLScreening [facility] Assessment Services) Patient's cognitive ability adequate to safely complete daily activities?: Yes Patient able to express need for assistance with ADLs?: Yes Independently performs ADLs?: Yes (appropriate for developmental age)  Prior Inpatient Therapy Prior Inpatient Therapy: No  Prior Outpatient Therapy Prior Outpatient Therapy: No Does patient have an ACCT team?: No Does patient have Intensive In-House Services?  : No Does patient have [facility] services? : No Does patient have P4CC services?: No  ADL Screening (condition at time of admission) Patient's cognitive ability adequate to safely complete daily activities?: Yes Is the patient deaf or have difficulty hearing?: No Does the patient have difficulty seeing, even when wearing glasses/contacts?: No Does the patient have difficulty concentrating, remembering, or making decisions?: No Patient able to express need for assistance with ADLs?: Yes Does the patient have difficulty dressing or bathing?: No Independently performs ADLs?: Yes (appropriate for  developmental age) Does the patient have difficulty walking or climbing stairs?: No Weakness of Legs: None Weakness of Arms/Hands: None  Home Assistive Devices/Equipment Home Assistive Devices/Equipment: None  Therapy Consults (therapy consults require a  physician order) PT Evaluation Needed: No OT Evalulation Needed: No SLP Evaluation Needed: No Abuse/Neglect Assessment (Assessment to be complete while patient is alone) Physical Abuse: Denies Verbal Abuse: Denies Sexual Abuse: Denies Values / Beliefs Cultural Requests During Hospitalization: None Spiritual Requests During Hospitalization: None Consults Spiritual Care Consult Needed: No Social Work Consult Needed: No Merchant navy officer (For Healthcare) Does Patient Have a Medical Advance Directive?: No Would patient like information on creating a medical advance directive?: No - Patient declined Nutrition Screen- [facility] Adult/[facility] Has the patient recently lost weight without trying?: Yes, 2-13 lbs. Has the patient been eating poorly because of a decreased appetite?: No Malnutrition Screening Tool Score: 1        Disposition: Per Berneice Heinrich, NP and Tyna Jaksch, NP, patient meets inpatient admission criteria Disposition Initial Assessment Completed for this Encounter: Yes Disposition of Patient: Admit Type of inpatient treatment program: Adult  On Site Evaluation by:   Reviewed with Physician:    Arnoldo Lenis Euell Schiff 05/29/2019 1:34 PM

## 2019-05-29 NOTE — ED Notes (Signed)
Pt paranoid, seeing things not there.

## 2019-05-29 NOTE — ED Provider Notes (Signed)
[facility]-EMERGENCY DEPT Provider Note   CSN: [id number] Arrival date & time: 05/29/19  1528     History   Chief Complaint Chief Complaint  Patient presents with  . Medical Clearance    HPI Walter Harper is a 43 y.o. male.     HPI Patient with history of alcohol abuse, prior TIA presents with his mother who assists with the HPI. Reportedly the patient has had no recent alcohol intake. She notes that over the past 2 days the patient has become anxious, with loud repetitive speech, insomnia. Patient seemingly completed a course of Librium after hospitalization 1 month ago following presentation with alcohol abuse. In the past days they have also been providing vanilla bean extract and lithium for mood stabilization, purchased over-the-counter. Today, with worsening manic-like behavior the patient was taken to our behavioral health hospital, and with concern for medical stability he was then brought here for evaluation. Patient self cannot provide any details of his history, level 5 caveat secondary to psychiatric condition, clinical mania. Past Medical History:  Diagnosis Date  . Alcohol abuse   . Hyperlipidemia   . TIA (transient ischemic attack)     Patient Active Problem List   Diagnosis Date Noted  . Alcohol withdrawal delirium (HCC) 05/06/2019  . Hallucinations 05/06/2019  . History of TIA (transient ischemic attack) 05/06/2019  . Cocaine abuse (HCC) 05/02/2019  . Acute lower UTI 05/02/2019  . Acute cerebrovascular accident (CVA) (HCC) 05/01/2019  . Alcohol dependence with uncomplicated withdrawal (HCC) 05/01/2019  . Hypokalemia 05/01/2019  . Hyperglycemia 05/01/2019    History reviewed. No pertinent surgical history.      Home Medications    Prior to Admission medications   Medication Sig Start Date End Date Taking? Authorizing Provider  aspirin EC 81 MG EC tablet Take 1 tablet (81 mg total) by mouth daily. 05/04/19   Glade Lloyd, MD  atorvastatin (LIPITOR) 20 MG tablet Take 1 tablet (20 mg total) by mouth daily at 6 PM. 05/03/19   Glade Lloyd, MD  cetirizine (ZYRTEC) 10 MG tablet Take 10 mg by mouth daily as needed for allergies.    [provider]  clopidogrel (PLAVIX) 75 MG tablet Take 1 tablet (75 mg total) by mouth daily. 05/04/19   Glade Lloyd, MD  folic acid (FOLVITE) 1 MG tablet Take 1 tablet (1 mg total) by mouth daily. 05/04/19   Glade Lloyd, MD  Multiple Vitamin (MULTIVITAMIN WITH MINERALS) TABS tablet Take 1 tablet by mouth daily. 05/04/19   Glade Lloyd, MD  thiamine 100 MG tablet Take 1 tablet (100 mg total) by mouth daily. 05/04/19   Glade Lloyd, MD    Family History No family history on file.  Social History Social History   Tobacco Use  . Smoking status: Never Smoker  . Smokeless tobacco: Never Used  Substance Use Topics  . Alcohol use: Yes    Comment: fifth a day  . Drug use: Never     Allergies   Patient has no known allergies.   Review of Systems Review of Systems  Unable to perform ROS: Psychiatric disorder     Physical Exam Updated Vital Signs BP (!) 134/92   Pulse 99   Temp 97.6 F (36.4 C)   Resp (!) 22   SpO2 94%   Physical Exam Vitals signs and nursing note reviewed.  Constitutional:      Appearance: He is well-developed.     Comments: Grossly manic adult male speaking in an  animated fashion, loud, repetitive, tangential, cussing.  HENT:     Head: Normocephalic and atraumatic.  Eyes:     Conjunctiva/sclera: Conjunctivae normal.  Pulmonary:     Effort: Pulmonary effort is normal. No respiratory distress.     Breath sounds: No stridor.  Abdominal:     General: There is no distension.  Skin:    General: Skin is warm and dry.  Neurological:     Mental Status: He is alert and oriented to person, place, and time.     Comments: Patient does not follow commands reliably, but is awake, alert, oriented x3, moving all extremities  spontaneously.  Psychiatric:        Mood and Affect: Mood is anxious. Affect is labile.        Speech: Speech is rapid and pressured.        Behavior: Behavior is hyperactive.        Thought Content: Thought content is delusional.        Cognition and Memory: Cognition is impaired.      ED Treatments / Results  Labs (all labs ordered are listed, but only abnormal results are displayed) Labs Reviewed  COMPREHENSIVE METABOLIC PANEL - Abnormal; Notable for the following components:      Result Value   Glucose, Bld 116 (*)    All other components within normal limits  LITHIUM LEVEL - Abnormal; Notable for the following components:   Lithium Lvl <0.06 (*)    All other components within normal limits  SARS CORONAVIRUS 2 (HOSPITAL ORDER, [facility])  ETHANOL  CBC WITH DIFFERENTIAL/PLATELET  URINALYSIS, ROUTINE W REFLEX MICROSCOPIC  RAPID URINE DRUG SCREEN, HOSP PERFORMED    EKG None  Radiology No results found.  Procedures Procedures (including critical care time)  Medications Ordered in ED Medications  LORazepam (ATIVAN) tablet 2 mg (has no administration in time range)  risperiDONE (RISPERDAL M-TABS) disintegrating tablet 2 mg (has no administration in time range)    And  LORazepam (ATIVAN) tablet 1 mg (has no administration in time range)    And  ziprasidone (GEODON) injection 20 mg (has no administration in time range)  OLANZapine (ZYPREXA) tablet 10 mg (10 mg Oral Given 05/29/19 1611)     Initial Impression / Assessment and Plan / ED Course  I have reviewed the triage vital signs and the nursing notes.  Pertinent labs & imaging results that were available during my care of the patient were reviewed by me and considered in my medical decision making (see chart for details).        9:31 PM Patient remains delusional, paranoid.  He has been accepted to behavioral health for transfer.  This adult male with history of alcohol abuse  presents with acute episode of mania. Patient is awake alert, cooperative, though unsettled, anxious, with stigmata of acute episode. Patient has no notable physical exam findings, labs are generally reassuring, labs reassuring, patient medically cleared for psych placement. Final Clinical Impressions(s) / ED Diagnoses   Final diagnoses:  Manic episode [facility])     Carmin Muskrat, MD 05/29/19 2133

## 2019-05-30 ENCOUNTER — Other Ambulatory Visit: Payer: Self-pay

## 2019-05-30 NOTE — ED Notes (Addendum)
Patient's wife in for visit. Wife requested her cell phone in room for tele visit. Educated she cannot have cell phone in patient room. Wife became angry and told patient to get up and that they were leaving. Writer explained to wife that patient is not discharged. Wife stated to give her the patient's clothes because they are leaving.  MD Kohut notified.

## 2019-05-30 NOTE — ED Notes (Signed)
Patient's significant other, Leann, called to state patient had neuro appt at 0830. Educated patient is not discharged from facility at this time. Spoke with Emogene Morgan, and provider. Also spoke with patient who stated he did not want to leave at this time. Still having auditory hallucinations. Provider deemed it unsafe to discharge patient at this time. Writer advised Leann of disposition.

## 2019-05-30 NOTE — ED Provider Notes (Signed)
Pt requesting to leave. Says he has telepsych visit previously scheduled at 1330 today. Wife at bedside. She is upset that they missed neurology appointment today at 0830 and had to reschedule next month. She does not want to miss another appointment. I explained that he has already been evaluated by a psychiatrist and their recommendation is inpatient treatment. I explained he would realistically get more benefit from inpatient treatment then an additional assessment by another psychiatrist as a new appointment. He still wants to go. I recommended he stay. He says he is no longer hearing voices although he reported he was just as recently as a few hours ago. He doesn't strike me as manic currently but thoughts are disorganized. I suspect his wife is pushing the discharge but I do not feel I have a basis to IVC him. They both say they are comfortable with discharge at this time. Discharged per request. Additional resources provided.    Virgel Manifold, MD 05/30/19 1355

## 2019-07-08 ENCOUNTER — Other Ambulatory Visit: Payer: Self-pay

## 2019-07-08 DIAGNOSIS — Z20822 Contact with and (suspected) exposure to covid-19: Secondary | ICD-10-CM

## 2019-07-09 LAB — NOVEL CORONAVIRUS, NAA: SARS-CoV-2, NAA: DETECTED — AB

## 2020-05-09 IMAGING — MR MRI HEAD WITHOUT CONTRAST
9 of 11 series · 33 of 48 positions shown · non-contrast
Comparison: CT studies yesterday and today.

CLINICAL DATA: Left arm numbness beginning yesterday. Negative CT
angiography.

EXAM:
MRI HEAD WITHOUT CONTRAST
TECHNIQUE: Multiplanar, multiecho pulse sequences of the brain and surrounding
structures were obtained without intravenous contrast.

[Series 2: DWI · axial · 3.0mm · 0.94mm/px · z∈[-104,+50]mm · 7 of 106 slices shown (1 of 2)]
[im 1/106]
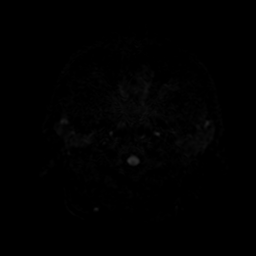
[im 18/106]
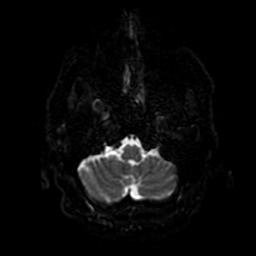
[im 36/106]
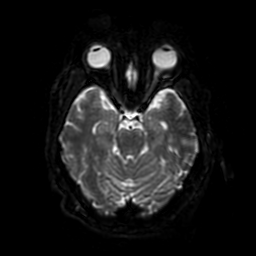
[im 53/106]
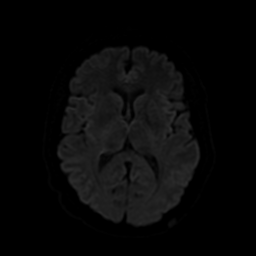
[im 71/106]
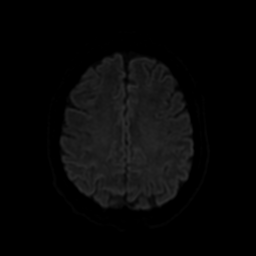
[im 88/106]
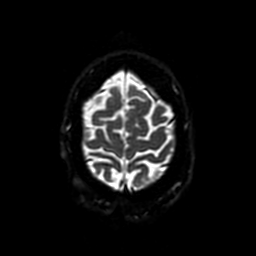
[im 106/106]
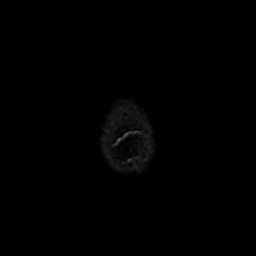

[Series 3: FLAIR · axial · 3.0mm · 0.45mm/px · z∈[-110,+45]mm · 2 of 27 slices shown (1 of 2)]
[im 1/27]
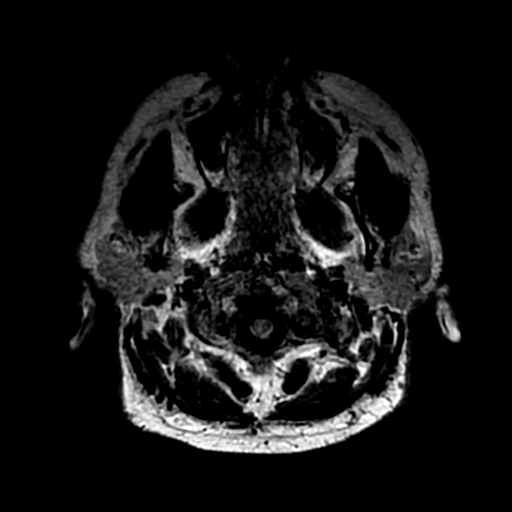
[im 27/27]
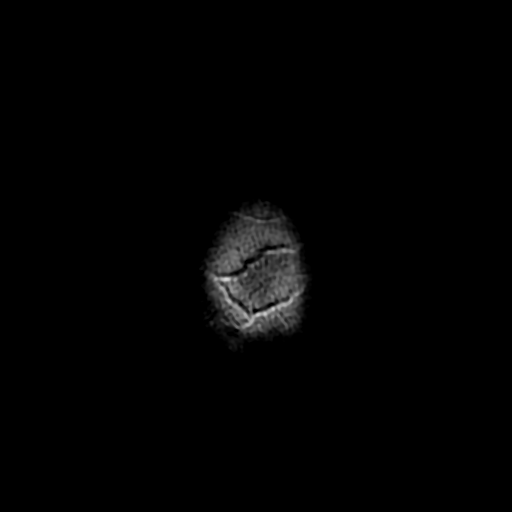

[Series 4: SWI · axial · 3.0mm · 0.47mm/px · z∈[-109,-23]mm · 5 of 104 slices shown]
[im 1/104]
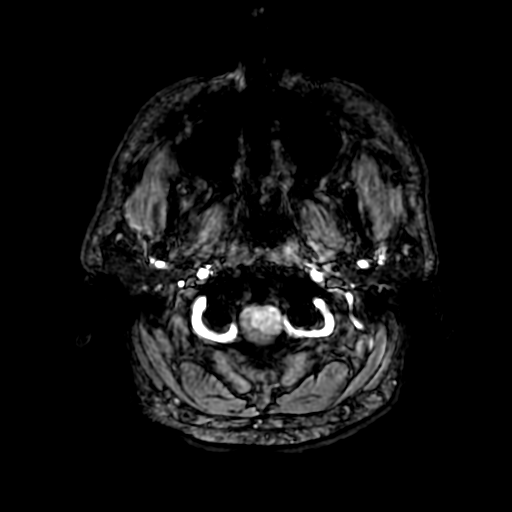
[im 15/104]
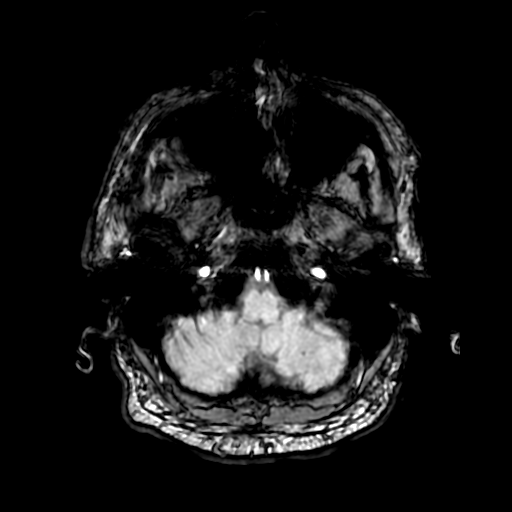
[im 30/104]
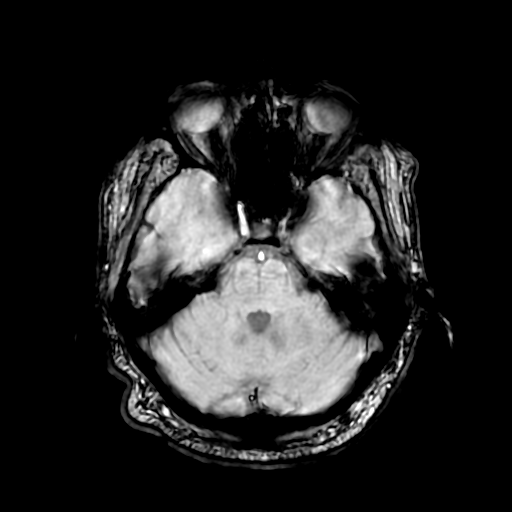
[im 45/104]
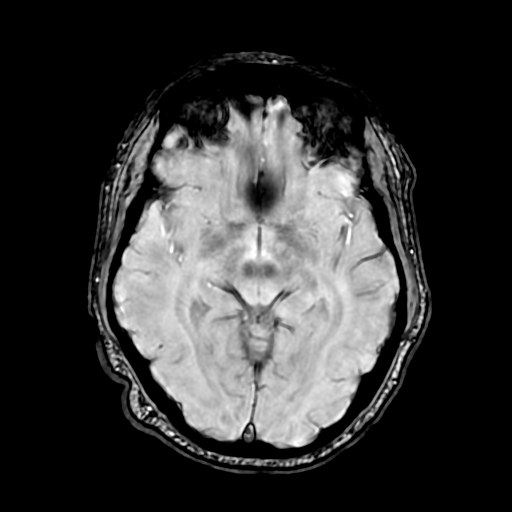
[im 59/104]
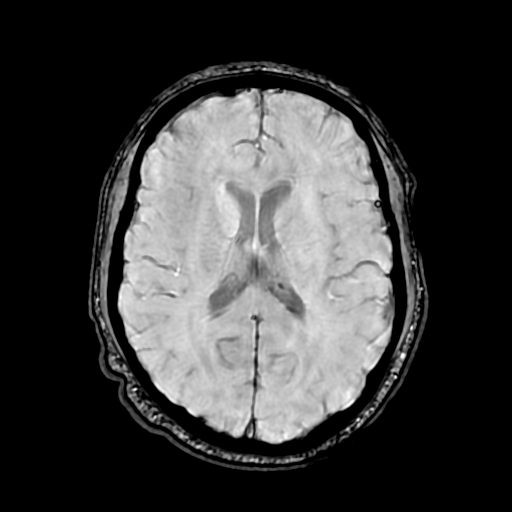

[Series 5: DWI · coronal · 4.0mm · 0.94mm/px · 6 of 74 slices shown (2 of 2)]
[im 1/74]
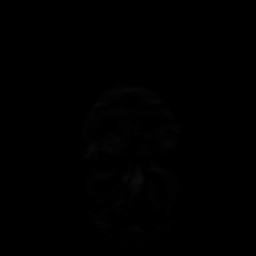
[im 15/74]
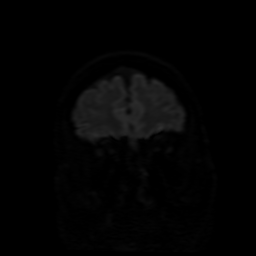
[im 30/74]
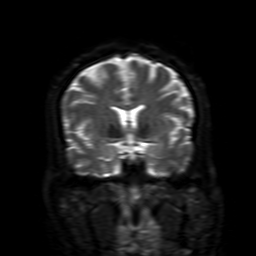
[im 44/74]
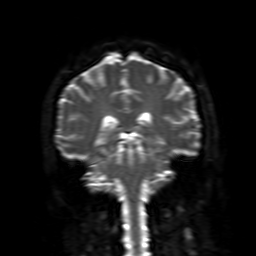
[im 59/74]
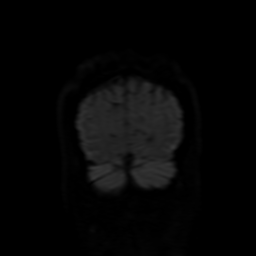
[im 74/74]
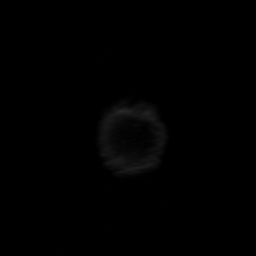

[Series 7: FLAIR · sagittal · 5.0mm · 0.47mm/px · 2 of 25 slices shown (2 of 2)]
[im 1/25]
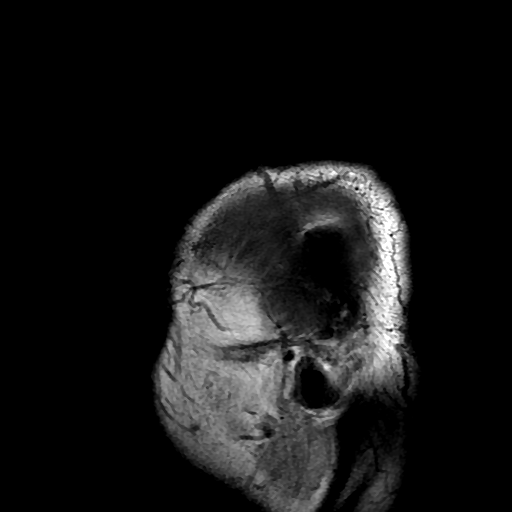
[im 25/25]
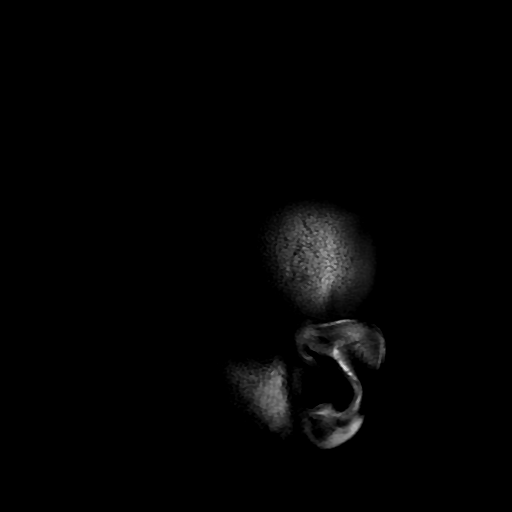

[Series 8: T2 · axial · 5.0mm · 0.47mm/px · z∈[-110,+44]mm · 2 of 27 slices shown (1 of 2)]
[im 1/27]
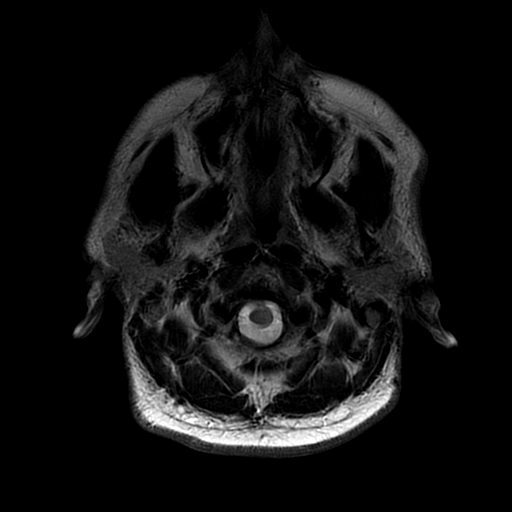
[im 27/27]
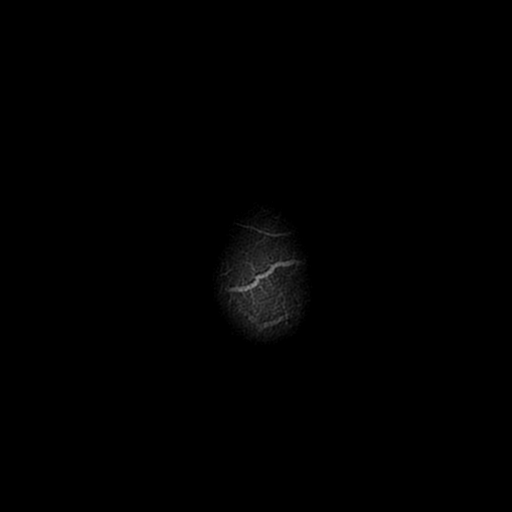

[Series 11: T2 · coronal · 5.0mm · 0.47mm/px · 2 of 32 slices shown (2 of 2)]
[im 1/32]
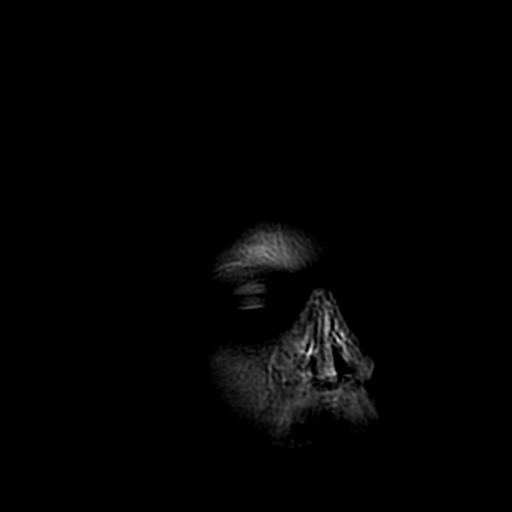
[im 32/32]
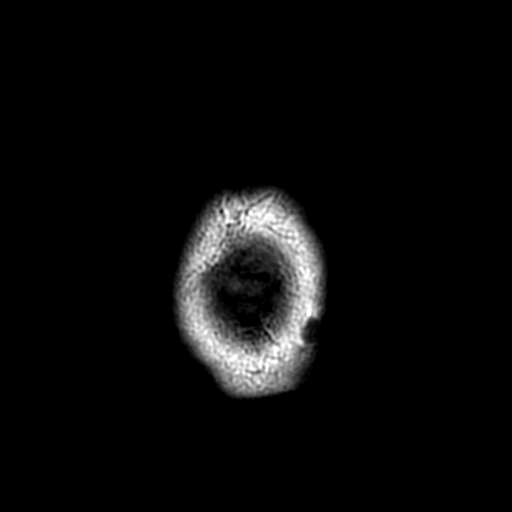

[Series 250: ADC · axial · 3.0mm · 0.94mm/px · z∈[-104,+50]mm · 4 of 53 slices shown (1 of 2)]
[im 1/53]
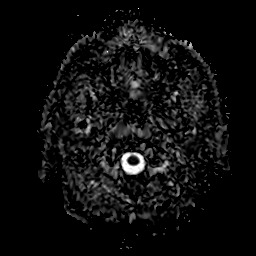
[im 18/53]
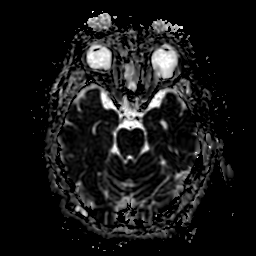
[im 35/53]
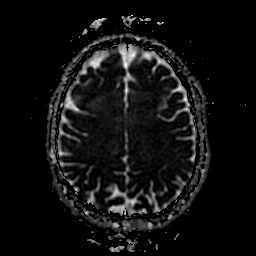
[im 53/53]
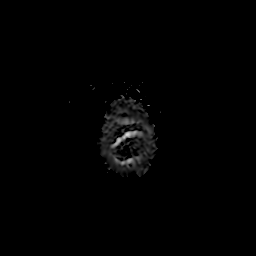

[Series 550: ADC · coronal · 4.0mm · 0.94mm/px · 3 of 37 slices shown (2 of 2)]
[im 1/37]
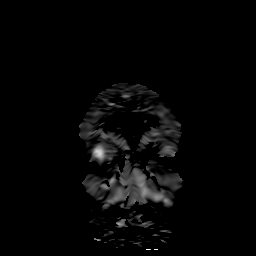
[im 19/37]
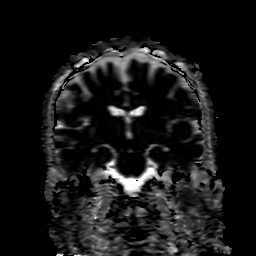
[im 37/37]
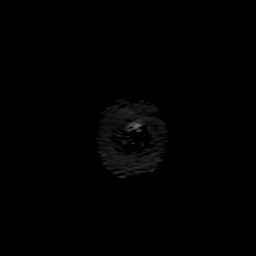

[33 of 48 positions shown; findings below may reference images not displayed]

FINDINGS: Brain: The brain has a normal appearance without evidence of
malformation, atrophy, old or acute small or large vessel
infarction, mass lesion, hemorrhage, hydrocephalus or extra-axial
collection.

Vascular: Major vessels at the base of the brain show flow. Venous
sinuses appear patent.

Skull and upper cervical spine: Normal.

Sinuses/Orbits: Clear/normal.

Other: None significant.
IMPRESSION: Normal examination. No abnormality seen to explain the clinical
presentation.

## 2023-03-27 ENCOUNTER — Emergency Department (HOSPITAL_BASED_OUTPATIENT_CLINIC_OR_DEPARTMENT_OTHER): Payer: BLUE CROSS/BLUE SHIELD

## 2023-03-27 ENCOUNTER — Emergency Department (HOSPITAL_BASED_OUTPATIENT_CLINIC_OR_DEPARTMENT_OTHER)
Admission: EM | Admit: 2023-03-27 | Discharge: 2023-03-27 | Disposition: A | Discharge status: Home / Self Care | Payer: BLUE CROSS/BLUE SHIELD | Attending: Emergency Medicine | Admitting: Emergency Medicine

## 2023-03-27 ENCOUNTER — Other Ambulatory Visit: Payer: Self-pay

## 2023-03-27 ENCOUNTER — Encounter (HOSPITAL_BASED_OUTPATIENT_CLINIC_OR_DEPARTMENT_OTHER): Payer: Self-pay | Admitting: Emergency Medicine

## 2023-03-27 DIAGNOSIS — N200 Calculus of kidney: Secondary | ICD-10-CM

## 2023-03-27 DIAGNOSIS — Z7902 Long term (current) use of antithrombotics/antiplatelets: Secondary | ICD-10-CM | POA: Insufficient documentation

## 2023-03-27 DIAGNOSIS — R109 Unspecified abdominal pain: Secondary | ICD-10-CM

## 2023-03-27 DIAGNOSIS — R1031 Right lower quadrant pain: Secondary | ICD-10-CM | POA: Insufficient documentation

## 2023-03-27 DIAGNOSIS — R112 Nausea with vomiting, unspecified: Secondary | ICD-10-CM | POA: Diagnosis not present

## 2023-03-27 DIAGNOSIS — Z7982 Long term (current) use of aspirin: Secondary | ICD-10-CM | POA: Insufficient documentation

## 2023-03-27 LAB — CBC WITH DIFFERENTIAL/PLATELET
Abs Immature Granulocytes: 0.01 10*3/uL (ref 0.00–0.07)
Basophils Absolute: 0 10*3/uL (ref 0.0–0.1)
Basophils Relative: 1 %
Eosinophils Absolute: 0.1 10*3/uL (ref 0.0–0.5)
Eosinophils Relative: 1 %
HCT: 46.6 % (ref 39.0–52.0)
Hemoglobin: 16.5 g/dL (ref 13.0–17.0)
Immature Granulocytes: 0 %
Lymphocytes Relative: 32 %
Lymphs Abs: 2.3 10*3/uL (ref 0.7–4.0)
MCH: 32.1 pg (ref 26.0–34.0)
MCHC: 35.4 g/dL (ref 30.0–36.0)
MCV: 90.7 fL (ref 80.0–100.0)
Monocytes Absolute: 0.4 10*3/uL (ref 0.1–1.0)
Monocytes Relative: 6 %
Neutro Abs: 4.3 10*3/uL (ref 1.7–7.7)
Neutrophils Relative %: 60 %
Platelets: 248 10*3/uL (ref 150–400)
RBC: 5.14 MIL/uL (ref 4.22–5.81)
RDW: 13 % (ref 11.5–15.5)
WBC: 7.1 10*3/uL (ref 4.0–10.5)
nRBC: 0 % (ref 0.0–0.2)

## 2023-03-27 LAB — COMPREHENSIVE METABOLIC PANEL
ALT: 12 U/L (ref 0–44)
AST: 16 U/L (ref 15–41)
Albumin: 4.7 g/dL (ref 3.5–5.0)
Alkaline Phosphatase: 67 U/L (ref 38–126)
Anion gap: 16 — ABNORMAL HIGH (ref 5–15)
BUN: 10 mg/dL (ref 6–20)
CO2: 21 mmol/L — ABNORMAL LOW (ref 22–32)
Calcium: 9.4 mg/dL (ref 8.9–10.3)
Chloride: 103 mmol/L (ref 98–111)
Creatinine, Ser: 0.83 mg/dL (ref 0.61–1.24)
GFR, Estimated: >60 mL/min (ref >60)
Glucose, Bld: 110 mg/dL — ABNORMAL HIGH (ref 70–99)
Potassium: 3.5 mmol/L (ref 3.5–5.1)
Sodium: 140 mmol/L (ref 135–145)
Total Bilirubin: 0.5 mg/dL (ref 0.3–1.2)
Total Protein: 7.5 g/dL (ref 6.5–8.1)

## 2023-03-27 LAB — URINALYSIS, ROUTINE W REFLEX MICROSCOPIC
Bilirubin Urine: NEGATIVE
Glucose, UA: NEGATIVE mg/dL
Leukocytes,Ua: NEGATIVE
Nitrite: NEGATIVE
Specific Gravity, Urine: 1.02 (ref 1.005–1.030)
pH: 5.5 (ref 5.0–8.0)

## 2023-03-27 LAB — URINALYSIS, MICROSCOPIC (REFLEX): RBC / HPF: >50 RBC/hpf (ref 0–5)

## 2023-03-27 LAB — LIPASE, BLOOD: Lipase: <10 U/L — ABNORMAL LOW (ref 11–51)

## 2023-03-27 MED ORDER — ONDANSETRON 4 MG PO TBDP: 4.0000 mg | ORAL_TABLET | Freq: Three times a day (TID) | ORAL | 1 refills | Status: AC | PRN

## 2023-03-27 MED ORDER — OXYCODONE HCL 5 MG PO TABS: 5.0000 mg | ORAL_TABLET | Freq: Four times a day (QID) | ORAL | 0 refills | Status: AC | PRN

## 2023-03-27 MED ORDER — SODIUM CHLORIDE 0.9 % IV BOLUS
1000.0000 mL | Freq: Once | INTRAVENOUS | Status: AC
  Administered 2023-03-27: 1000 mL via INTRAVENOUS

## 2023-03-27 MED ORDER — ONDANSETRON HCL 4 MG/2ML IJ SOLN
4.0000 mg | Freq: Once | INTRAMUSCULAR | Status: AC
  Administered 2023-03-27: 4 mg via INTRAVENOUS
  Filled 2023-03-27: qty 2

## 2023-03-27 MED ORDER — NAPROXEN 500 MG PO TABS: 500.0000 mg | ORAL_TABLET | Freq: Two times a day (BID) | ORAL | 0 refills | Status: DC

## 2023-03-27 MED ORDER — KETOROLAC TROMETHAMINE 30 MG/ML IJ SOLN
30.0000 mg | Freq: Once | INTRAMUSCULAR | Status: AC
  Administered 2023-03-27: 30 mg via INTRAVENOUS
  Filled 2023-03-27: qty 1

## 2023-03-27 MED ORDER — KETOROLAC TROMETHAMINE 10 MG PO TABS: 10.0000 mg | ORAL_TABLET | Freq: Four times a day (QID) | ORAL | 0 refills | Status: AC | PRN

## 2023-03-27 MED ORDER — IOHEXOL 300 MG/ML  SOLN
100.0000 mL | Freq: Once | INTRAMUSCULAR | Status: AC | PRN
  Administered 2023-03-27: 85 mL via INTRAVENOUS

## 2023-03-27 MED ORDER — TAMSULOSIN HCL 0.4 MG PO CAPS: 0.4000 mg | ORAL_CAPSULE | Freq: Every day | ORAL | 0 refills | Status: AC

## 2023-03-27 MED ORDER — HYDROMORPHONE HCL 1 MG/ML IJ SOLN
1.0000 mg | Freq: Once | INTRAMUSCULAR | Status: AC
  Administered 2023-03-27: 1 mg via INTRAVENOUS
  Filled 2023-03-27: qty 1

## 2023-03-27 MED ORDER — SODIUM CHLORIDE 0.9 % IV SOLN: INTRAVENOUS | Status: DC

## 2023-03-27 NOTE — ED Triage Notes (Signed)
Patient endorses right flank that started this AM when he woke up and intermittent hand "tingling."  Patient hyperventilating in triage.

## 2023-03-27 NOTE — Discharge Instructions (Addendum)
Make an appointment follow-up with urology if symptoms do not improve in 7-14 days.  Return for any new or worse symptoms.  Work note provided.

## 2023-03-27 NOTE — ED Notes (Signed)
Pain improved after Toradol.

## 2023-03-27 NOTE — ED Notes (Signed)
Pt transported to CT ?

## 2023-03-27 NOTE — ED Provider Notes (Signed)
3:54 PM Patient signed out to me by previous ED physician. Pt is a 47 yo male presenting with right CVA, right flank, and RLQ pain.   Urine concerning for rbcs.  Imaging: This examination confirms presence of a 2 mm right UVJ calculus causing acute right obstructive uropathy.  Physical Exam  BP (!) 146/95   Pulse 68   Temp 98 F (36.7 C) (Oral)   Resp 18   Ht 6' (1.829 m)   Wt 89.4 kg   SpO2 100%   BMI 26.72 kg/m   Physical Exam  Procedures  Procedures  ED Course / MDM    Medical Decision Making Amount and/or Complexity of Data Reviewed Labs: ordered. Radiology: ordered.  Risk Prescription drug management.   2mm calculus seen at right UVJ on radiology addendum. Hematuria on UA without UTI. Stable renal function. Pain controlled at this time. Pt safe for DC with pain meds. On call urology contact information provided if symptoms do not improve in the next 5-7 days. Flomax prescribed.        Franne Forts, DO 03/28/23 1257

## 2023-03-27 NOTE — ED Provider Notes (Addendum)
[place] EMERGENCY DEPARTMENT AT [facility] Provider Note   CSN: [id number] Arrival date & time: 03/27/23  1107     History  Chief Complaint  Patient presents with   Flank Pain   Numbness    Walter Harper is a 47 y.o. male.  Patient with acute onset of right flank right lower quadrant and right back pain.  Started at 830 this morning.  Associated with some nausea and vomiting.  No history of kidney stones.  Patient has a history of alcohol abuse hyperlipidemia history of TIA.  Patient seems to be very anxious.  Patient not tachycardic no fever blood pressure 141/95 oxygen sats 100%.  Patient states that he drinks 1/5 a day.  Never used tobacco products.       Home Medications Prior to Admission medications   Medication Sig Start Date End Date Taking? Authorizing Provider  aspirin EC 81 MG EC tablet Take 1 tablet (81 mg total) by mouth daily. 05/04/19   Glade Lloyd, MD  atorvastatin (LIPITOR) 20 MG tablet Take 1 tablet (20 mg total) by mouth daily at 6 PM. 05/03/19   Glade Lloyd, MD  cetirizine (ZYRTEC) 10 MG tablet Take 10 mg by mouth daily as needed for allergies.    [provider]  chlordiazePOXIDE (LIBRIUM) 25 MG capsule Take 25 mg by mouth 3 (three) times daily as needed for anxiety or withdrawal.  05/13/19   [provider]  clopidogrel (PLAVIX) 75 MG tablet Take 1 tablet (75 mg total) by mouth daily. 05/04/19   Glade Lloyd, MD  ezetimibe (ZETIA) 10 MG tablet Take 10 mg by mouth daily. 05/19/19   [provider]  folic acid (FOLVITE) 1 MG tablet Take 1 tablet (1 mg total) by mouth daily. 05/04/19   Glade Lloyd, MD  Multiple Vitamin (MULTIVITAMIN WITH MINERALS) TABS tablet Take 1 tablet by mouth daily. 05/04/19   Glade Lloyd, MD  thiamine 100 MG tablet Take 1 tablet (100 mg total) by mouth daily. 05/04/19   Glade Lloyd, MD      Allergies    Patient has no known allergies.    Review of Systems   Review of Systems   Constitutional:  Negative for chills and fever.  HENT:  Negative for ear pain and sore throat.   Eyes:  Negative for pain and visual disturbance.  Respiratory:  Negative for cough and shortness of breath.   Cardiovascular:  Negative for chest pain and palpitations.  Gastrointestinal:  Positive for abdominal pain, nausea and vomiting.  Genitourinary:  Positive for flank pain. Negative for dysuria and hematuria.  Musculoskeletal:  Negative for arthralgias and back pain.  Skin:  Negative for color change and rash.  Neurological:  Negative for seizures and syncope.  All other systems reviewed and are negative.   Physical Exam Updated Vital Signs BP (!) 146/95   Pulse 68   Temp 98 F (36.7 C) (Oral)   Resp 18   Ht 1.829 m (6')   Wt 89.4 kg   SpO2 100%   BMI 26.72 kg/m  Physical Exam Vitals and nursing note reviewed.  Constitutional:      General: He is not in acute distress.    Appearance: Normal appearance. He is well-developed. He is ill-appearing.  HENT:     Head: Normocephalic and atraumatic.  Eyes:     Extraocular Movements: Extraocular movements intact.     Conjunctiva/sclera: Conjunctivae normal.     Pupils: Pupils are equal, round, and reactive to light.  Cardiovascular:     Rate and Rhythm: Normal rate and regular rhythm.     Heart sounds: No murmur heard. Pulmonary:     Effort: Pulmonary effort is normal. No respiratory distress.     Breath sounds: Normal breath sounds.  Abdominal:     Palpations: Abdomen is soft.     Tenderness: There is abdominal tenderness.     Comments: Tender to palpation right lower quadrant.  Musculoskeletal:        General: No swelling.     Cervical back: Normal range of motion and neck supple.  Skin:    General: Skin is warm and dry.     Capillary Refill: Capillary refill takes less than 2 seconds.  Neurological:     General: No focal deficit present.     Mental Status: He is alert and oriented to person, place, and time.   Psychiatric:        Mood and Affect: Mood normal.     ED Results / Procedures / Treatments   Labs (all labs ordered are listed, but only abnormal results are displayed) Labs Reviewed  COMPREHENSIVE METABOLIC PANEL - Abnormal; Notable for the following components:      Result Value   CO2 21 (*)    Glucose, Bld 110 (*)    Anion gap 16 (*)    All other components within normal limits  URINALYSIS, ROUTINE W REFLEX MICROSCOPIC - Abnormal; Notable for the following components:   Hgb urine dipstick LARGE (*)    Ketones, ur TRACE (*)    Protein, ur TRACE (*)    All other components within normal limits  URINALYSIS, MICROSCOPIC (REFLEX) - Abnormal; Notable for the following components:   Bacteria, UA RARE (*)    All other components within normal limits  LIPASE, BLOOD - Abnormal; Notable for the following components:   Lipase <10 (*)    All other components within normal limits  CBC WITH DIFFERENTIAL/PLATELET    EKG None  Radiology CT Renal Stone Study  Result Date: 03/27/2023 CLINICAL DATA:  Abdominal/flank pain, stone suspected EXAM: CT ABDOMEN AND PELVIS WITHOUT CONTRAST TECHNIQUE: Multidetector CT imaging of the abdomen and pelvis was performed following the standard protocol without IV contrast. RADIATION DOSE REDUCTION: This exam was performed according to the departmental dose-optimization program which includes automated exposure control, adjustment of the mA and/or kV according to patient size and/or use of iterative reconstruction technique. COMPARISON:  None Available. FINDINGS: Lower chest: No acute abnormality. Hepatobiliary: No focal liver abnormality is seen. No gallstones, gallbladder wall thickening, or biliary dilatation. Pancreas: Unremarkable. No pancreatic ductal dilatation or surrounding inflammatory changes. Spleen: Normal in size without focal abnormality. Adrenals/Urinary Tract: Adrenal glands are unremarkable. 4 mm calculus about the interpolar region of the  right kidney. Another punctate calculus in the lower pole of the right kidney. Punctate calculus in the lower pole of the left kidney. No appreciable ureteral calculus. There is mild right hydronephrosis, which may be secondary to recently passed calculus or punctate non radiopaque calculus in the distal right ureter. Bladder is unremarkable. Stomach/Bowel: Stomach is within normal limits. Appendix not definitely identified, however no inflammatory changes in the pericecal region. No evidence of bowel wall thickening, distention, or inflammatory changes. Vascular/Lymphatic: No significant vascular findings are present. No enlarged abdominal or pelvic lymph nodes. Reproductive: Prostate is unremarkable. Other: No abdominal wall hernia or abnormality. No abdominopelvic ascites. Musculoskeletal: No acute or significant osseous findings. IMPRESSION: 1. Multiple bilateral renal calculi as detailed above. No appreciable  ureteral calculus. Mild right hydronephrosis, which may be secondary to recently passed calculus or punctate non radiopaque calculus in the distal right ureter. 1. Bowel loops are normal in caliber. Appendix not definitely identified, however no inflammatory changes in the pericecal region. Electronically Signed   By: Larose Hires D.O.   On: 03/27/2023 13:14    Procedures Procedures    Medications Ordered in ED Medications  0.9 %  sodium chloride infusion ( Intravenous New Bag/Given 03/27/23 1324)  ketorolac (TORADOL) 30 MG/ML injection 30 mg (has no administration in time range)  sodium chloride 0.9 % bolus 1,000 mL (0 mLs Intravenous Stopped 03/27/23 1341)  ondansetron (ZOFRAN) injection 4 mg (4 mg Intravenous Given 03/27/23 1217)  HYDROmorphone (DILAUDID) injection 1 mg (1 mg Intravenous Given 03/27/23 1219)  HYDROmorphone (DILAUDID) injection 1 mg (1 mg Intravenous Given 03/27/23 1334)    ED Course/ Medical Decision Making/ A&P                             Medical Decision Making Amount  and/or Complexity of Data Reviewed Labs: ordered. Radiology: ordered.  Risk Prescription drug management.   Workup here in the way patient appeared clinically very suggestive of possible kidney stone.  Does have greater than 50 RBCs in the urine.  Does not appear to be going through alcohol withdrawal.  CT scan raises evidence of bilateral stones up in the kidney and looks like there was a recent passage of a stone on the right side where patient's pain is.  CBC no leukocytosis hemoglobin 16.  Complete metabolic panel despite his history of drinking LFTs are normal.  Lipase normal.  Urinalysis is already mentioned significant for greater than 50 RBCs.  Patient states he is absolutely no better at all.  Will try some Toradol.  And will go ahead and do CT abdomen pelvis with IV contrast deceiving get a better look at the appendix.  Clinically this seems to all add up to the stone.  May be it is a stone that was not identified on the CT renal.  Patient feeling much better after the Toradol which goes along with the kidney stone diagnosis.  CT scan still pending.  He stated this was done with IV contrast just to rule out appendicitis.  Final Clinical Impression(s) / ED Diagnoses Final diagnoses:  Right lower quadrant abdominal pain  Flank pain    Rx / DC Orders ED Discharge Orders     None         Vanetta Mulders, MD 03/27/23 1436    Vanetta Mulders, MD 03/27/23 1517    Vanetta Mulders, MD 03/27/23 [phone]

## 2023-03-27 NOTE — ED Notes (Signed)
Pt verbalized understanding of d/c instructions, meds, and followup care. Denies questions. VSS, no distress noted. Steady gait to exit with all belongings.  ?

## 2024-06-13 ENCOUNTER — Emergency Department (HOSPITAL_BASED_OUTPATIENT_CLINIC_OR_DEPARTMENT_OTHER)
Admission: EM | Admit: 2024-06-13 | Discharge: 2024-06-13 | Disposition: A | Discharge status: Home / Self Care | Attending: Emergency Medicine | Admitting: Emergency Medicine

## 2024-06-13 ENCOUNTER — Emergency Department (HOSPITAL_BASED_OUTPATIENT_CLINIC_OR_DEPARTMENT_OTHER)

## 2024-06-13 ENCOUNTER — Other Ambulatory Visit: Payer: Self-pay

## 2024-06-13 ENCOUNTER — Encounter (HOSPITAL_BASED_OUTPATIENT_CLINIC_OR_DEPARTMENT_OTHER): Payer: Self-pay | Admitting: Emergency Medicine

## 2024-06-13 DIAGNOSIS — Z7982 Long term (current) use of aspirin: Secondary | ICD-10-CM | POA: Diagnosis not present

## 2024-06-13 DIAGNOSIS — R062 Wheezing: Secondary | ICD-10-CM | POA: Insufficient documentation

## 2024-06-13 DIAGNOSIS — Z8673 Personal history of transient ischemic attack (TIA), and cerebral infarction without residual deficits: Secondary | ICD-10-CM | POA: Insufficient documentation

## 2024-06-13 DIAGNOSIS — Z7901 Long term (current) use of anticoagulants: Secondary | ICD-10-CM | POA: Diagnosis not present

## 2024-06-13 DIAGNOSIS — R059 Cough, unspecified: Secondary | ICD-10-CM | POA: Diagnosis present

## 2024-06-13 LAB — CBC
HCT: 43.1 % (ref 39.0–52.0)
Hemoglobin: 14.9 g/dL (ref 13.0–17.0)
MCH: 31.4 pg (ref 26.0–34.0)
MCHC: 34.6 g/dL (ref 30.0–36.0)
MCV: 90.9 fL (ref 80.0–100.0)
Platelets: 187 K/uL (ref 150–400)
RBC: 4.74 MIL/uL (ref 4.22–5.81)
RDW: 13.2 % (ref 11.5–15.5)
WBC: 4.3 K/uL (ref 4.0–10.5)
nRBC: 0 % (ref 0.0–0.2)

## 2024-06-13 LAB — RESP PANEL BY RT-PCR (RSV, FLU A&B, COVID)  RVPGX2
Influenza A by PCR: NEGATIVE
Influenza B by PCR: NEGATIVE
Resp Syncytial Virus by PCR: NEGATIVE
SARS Coronavirus 2 by RT PCR: NEGATIVE

## 2024-06-13 MED ORDER — ALBUTEROL SULFATE (2.5 MG/3ML) 0.083% IN NEBU
5.0000 mg | INHALATION_SOLUTION | Freq: Once | RESPIRATORY_TRACT | Status: AC
  Administered 2024-06-13: 5 mg via RESPIRATORY_TRACT
  Filled 2024-06-13: qty 6

## 2024-06-13 MED ORDER — PREDNISONE 20 MG PO TABS
20.0000 mg | ORAL_TABLET | Freq: Once | ORAL | Status: AC
  Administered 2024-06-13: 20 mg via ORAL
  Filled 2024-06-13: qty 1

## 2024-06-13 MED ORDER — PREDNISONE 10 MG PO TABS: 20.0000 mg | ORAL_TABLET | Freq: Every day | ORAL | 0 refills | Status: AC

## 2024-06-13 MED ORDER — ALBUTEROL SULFATE HFA 108 (90 BASE) MCG/ACT IN AERS
2.0000 | INHALATION_SPRAY | RESPIRATORY_TRACT | Status: DC | PRN
  Administered 2024-06-13: 2 via RESPIRATORY_TRACT
  Filled 2024-06-13: qty 6.7

## 2024-06-13 NOTE — ED Provider Notes (Signed)
 [place] [facility] AT [facility] Provider Note   CSN: [id number] Arrival date & time: 06/13/24  9270     Patient presents with: Cough   Walter Harper is a 48 y.o. male.   HPI 48 year old male history of CVA, polysubstance use, hyperglycemia, presents today complaining of cough.  He states around 4:30 in the morning he woke up with coughing that was productive of some sputum.  He describes wheezing with some dyspnea.  He denies fever or chills, rhinorrhea, sore throat.  He does not report any similar symptoms.  He denies any smoking.    Prior to Admission medications   Medication Sig Start Date End Date Taking? Authorizing Provider  predniSONE (DELTASONE) 10 MG tablet Take 2 tablets (20 mg total) by mouth daily. 06/13/24  Yes Levander Houston, MD  aspirin  EC 81 MG EC tablet Take 1 tablet (81 mg total) by mouth daily. 05/04/19   Cheryle Page, MD  atorvastatin  (LIPITOR) 20 MG tablet Take 1 tablet (20 mg total) by mouth daily at 6 PM. 05/03/19   Cheryle Page, MD  cetirizine (ZYRTEC) 10 MG tablet Take 10 mg by mouth daily as needed for allergies.    [provider]  chlordiazePOXIDE (LIBRIUM) 25 MG capsule Take 25 mg by mouth 3 (three) times daily as needed for anxiety or withdrawal.  05/13/19   [provider]  clopidogrel  (PLAVIX ) 75 MG tablet Take 1 tablet (75 mg total) by mouth daily. 05/04/19   Cheryle Page, MD  ezetimibe (ZETIA) 10 MG tablet Take 10 mg by mouth daily. 05/19/19   [provider]  folic acid  (FOLVITE ) 1 MG tablet Take 1 tablet (1 mg total) by mouth daily. 05/04/19   Cheryle Page, MD  ketorolac  (TORADOL ) 10 MG tablet Take 1 tablet (10 mg total) by mouth every 6 (six) hours as needed for moderate pain. 03/27/23   Gray, Alicia P, DO  Multiple Vitamin (MULTIVITAMIN WITH MINERALS) TABS tablet Take 1 tablet by mouth daily. 05/04/19   Cheryle Page, MD  ondansetron  (ZOFRAN -ODT) 4 MG disintegrating tablet Take 1 tablet (4  mg total) by mouth every 8 (eight) hours as needed for nausea or vomiting. 03/27/23   Zackowski, Scott, MD  tamsulosin  (FLOMAX ) 0.4 MG CAPS capsule Take 1 capsule (0.4 mg total) by mouth daily. 03/27/23   Elnor Hila P, DO  thiamine  100 MG tablet Take 1 tablet (100 mg total) by mouth daily. 05/04/19   Cheryle Page, MD    Allergies: Patient has no known allergies.    Review of Systems  Updated Vital Signs BP (!) 141/91   Pulse 70   Temp 98.9 F (37.2 C) (Oral)   Resp 18   SpO2 99%   Physical Exam Vitals reviewed.  HENT:     Head: Normocephalic.     Right Ear: External ear normal.     Left Ear: External ear normal.     Nose: Nose normal.     Mouth/Throat:     Pharynx: Oropharynx is clear.  Eyes:     Pupils: Pupils are equal, round, and reactive to light.  Cardiovascular:     Rate and Rhythm: Normal rate and regular rhythm.     Pulses: Normal pulses.  Pulmonary:     Effort: Pulmonary effort is normal.     Breath sounds: Wheezing present.  Abdominal:     General: Abdomen is flat.     Palpations: Abdomen is soft.  Musculoskeletal:        General:  Normal range of motion.     Cervical back: Normal range of motion.     Right lower leg: No edema.     Left lower leg: No edema.  Skin:    General: Skin is warm and dry.     Capillary Refill: Capillary refill takes less than 2 seconds.  Neurological:     General: No focal deficit present.     Mental Status: He is alert.  Psychiatric:        Mood and Affect: Mood normal.     (all labs ordered are listed, but only abnormal results are displayed) Labs Reviewed  RESP PANEL BY RT-PCR (RSV, FLU A&B, COVID)  [id number]  CBC    EKG: None  Radiology: [facility] Chest Port 1 View Result Date: 06/13/2024 CLINICAL DATA:  Cough and wheezing. EXAM: PORTABLE CHEST 1 VIEW COMPARISON:  None Available. FINDINGS: The heart size and mediastinal contours are within normal limits. Both lungs are clear. The visualized skeletal structures are  unremarkable. IMPRESSION: No active disease. Electronically Signed   By: Lynwood Landy Raddle M.D.   On: 06/13/2024 08:21     Procedures   Medications Ordered in the ED  albuterol  (VENTOLIN  HFA) 108 (90 Base) MCG/ACT inhaler 2 puff (has no administration in time range)  predniSONE (DELTASONE) tablet 20 mg (has no administration in time range)  albuterol  (PROVENTIL ) (2.5 MG/3ML) 0.083% nebulizer solution 5 mg (5 mg Nebulization Given 06/13/24 0752)    Clinical Course as of 06/13/24 0929  Fri Jun 13, 2024  0825 Chest x-Jenney Brester reviewed and interpreted no evidence of acute abnormalities noted radiologist interpretation concurs [DR]    Clinical Course User Index [DR] Levander Houston, MD                                 Medical Decision Making Amount and/or Complexity of Data Reviewed Labs: ordered. Radiology: ordered.  Risk Prescription drug management.   48 year old male presents today complaining of cough and wheezing.  Differential diagnosis includes but is not limited to asthmatic bronchitis, other pulmonary infections including pneumonia, acute allergic reaction, using secondary to pulmonary edema. Patient was evaluated with physical exam and did have some initial diffuse inspiratory and expiratory wheezes. No rales or fever were noted. No peripheral edema Patient evaluated with chest x-Jash Wahlen which is clear. Patient evaluated with CBC which shows no evidence of acute abnormalities. Patient was evaluated with respiratory panel which is negative for COVID, flu, and RSV. Patient treated here with albuterol  nebulizer. Post albuterol  patient feels improved and has no wheezing noted on exam Oxygen saturations have remained 99% with normal respiratory rate and heart rate. No evidence of volume overload noted on exam Patient responded well to albuterol  making this likely bronchospasm. Does have seasonal allergies.  He is not a smoker. Plan ongoing albuterol  and will give short Dosepak of  prednisone.     Final diagnoses:  Wheezing    ED Discharge Orders          Ordered    predniSONE (DELTASONE) 10 MG tablet  Daily        06/13/24 0929               Levander Houston, MD 06/13/24 [phone]

## 2024-06-13 NOTE — Discharge Instructions (Signed)
 Please use albuterol  inhaler up to every 3 hours. Take prednisone as prescribed. Recheck with your doctor next week Return if you are having any worsening shortness of breath or other new symptoms such as chest pain

## 2024-06-13 NOTE — ED Triage Notes (Signed)
 Reports waking up around 0400 this morning with SHOB and cough. States difficulty with taking deep breath. Wheezing. Denies fevers.

## 2024-06-13 NOTE — ED Notes (Signed)
 Pt given discharge instructions and reviewed prescriptions. Opportunities given for questions. Pt verbalizes understanding. Jillyn Hidden, RN
